# Patient Record
Sex: Male | Born: 1950
Health system: Southern US, Community
[De-identification: ages and names within clinical notes are randomized; demographics above are authoritative.]

## PROBLEM LIST (undated history)

## (undated) DIAGNOSIS — G709 Myoneural disorder, unspecified: Secondary | ICD-10-CM

## (undated) DIAGNOSIS — G40909 Epilepsy, unspecified, not intractable, without status epilepticus: Secondary | ICD-10-CM

## (undated) DIAGNOSIS — K219 Gastro-esophageal reflux disease without esophagitis: Secondary | ICD-10-CM

## (undated) HISTORY — DX: Gastro-esophageal reflux disease without esophagitis: K21.9

## (undated) HISTORY — DX: Myoneural disorder, unspecified: G70.9

## (undated) HISTORY — PX: EYE SURGERY: SHX253

## (undated) HISTORY — DX: Epilepsy, unspecified, not intractable, without status epilepticus: G40.909

## (undated) HISTORY — PX: SPINE SURGERY: SHX786

## (undated) HISTORY — PX: OTHER SURGICAL HISTORY: SHX169

---

## 2006-09-22 ENCOUNTER — Encounter: Payer: Self-pay | Admitting: Family Medicine

## 2006-09-22 LAB — CONVERTED CEMR LAB
HCT: 44.7 %
Hemoglobin: 15.2 g/dL
Platelets: 215 10*3/uL
WBC, blood: 6.1 10*3/uL

## 2006-12-20 ENCOUNTER — Encounter: Payer: Self-pay | Admitting: Family Medicine

## 2006-12-20 LAB — CONVERTED CEMR LAB
HDL: 45 mg/dL
Triglycerides: 183 mg/dL

## 2007-01-18 ENCOUNTER — Ambulatory Visit: Payer: Self-pay | Admitting: Family Medicine

## 2007-01-18 DIAGNOSIS — G40909 Epilepsy, unspecified, not intractable, without status epilepticus: Secondary | ICD-10-CM | POA: Insufficient documentation

## 2007-01-18 LAB — CONVERTED CEMR LAB
Blood in Urine, dipstick: NEGATIVE
Ketones, urine, test strip: NEGATIVE
Nitrite: NEGATIVE
Specific Gravity, Urine: 1.02
Urobilinogen, UA: NEGATIVE
WBC Urine, dipstick: NEGATIVE

## 2007-01-25 ENCOUNTER — Encounter: Payer: Self-pay | Admitting: Family Medicine

## 2007-02-04 ENCOUNTER — Encounter: Payer: Self-pay | Admitting: Family Medicine

## 2007-02-22 ENCOUNTER — Encounter: Payer: Self-pay | Admitting: Family Medicine

## 2007-02-23 ENCOUNTER — Encounter: Payer: Self-pay | Admitting: Family Medicine

## 2007-02-23 LAB — CONVERTED CEMR LAB
ALT: 24 units/L
AST: 16 units/L
Albumin: 4.4 g/dL
Alkaline Phosphatase: 69 units/L
Calcium: 9 mg/dL
Chloride: 104 meq/L
HDL: 47 mg/dL
LDL Cholesterol: 144 mg/dL
Potassium: 4.2 meq/L
Sodium: 141 meq/L
Total Protein: 7 g/dL
Triglycerides: 211 mg/dL

## 2007-03-02 ENCOUNTER — Encounter: Admission: RE | Admit: 2007-03-02 | Discharge: 2007-03-02 | Payer: Self-pay | Admitting: Family Medicine

## 2007-03-02 ENCOUNTER — Telehealth: Payer: Self-pay | Admitting: Family Medicine

## 2007-03-02 ENCOUNTER — Ambulatory Visit: Payer: Self-pay | Admitting: Family Medicine

## 2007-03-03 ENCOUNTER — Encounter: Payer: Self-pay | Admitting: Family Medicine

## 2007-03-04 ENCOUNTER — Encounter: Admission: RE | Admit: 2007-03-04 | Discharge: 2007-03-14 | Payer: Self-pay | Admitting: Family Medicine

## 2007-03-14 ENCOUNTER — Encounter: Payer: Self-pay | Admitting: Family Medicine

## 2007-03-16 ENCOUNTER — Ambulatory Visit: Payer: Self-pay | Admitting: Family Medicine

## 2007-03-16 DIAGNOSIS — R7301 Impaired fasting glucose: Secondary | ICD-10-CM | POA: Insufficient documentation

## 2007-04-01 ENCOUNTER — Encounter: Payer: Self-pay | Admitting: Family Medicine

## 2007-04-14 ENCOUNTER — Telehealth (INDEPENDENT_AMBULATORY_CARE_PROVIDER_SITE_OTHER): Payer: Self-pay | Admitting: *Deleted

## 2007-05-06 ENCOUNTER — Encounter: Payer: Self-pay | Admitting: Family Medicine

## 2007-06-17 ENCOUNTER — Ambulatory Visit: Payer: Self-pay | Admitting: Family Medicine

## 2007-06-24 ENCOUNTER — Encounter: Payer: Self-pay | Admitting: Family Medicine

## 2007-07-15 ENCOUNTER — Telehealth: Payer: Self-pay | Admitting: Family Medicine

## 2007-07-18 ENCOUNTER — Telehealth: Payer: Self-pay | Admitting: Family Medicine

## 2007-08-05 ENCOUNTER — Encounter: Payer: Self-pay | Admitting: Family Medicine

## 2007-11-11 ENCOUNTER — Encounter: Admission: RE | Admit: 2007-11-11 | Discharge: 2007-11-11 | Payer: Self-pay | Admitting: General Surgery

## 2007-11-15 ENCOUNTER — Encounter (INDEPENDENT_AMBULATORY_CARE_PROVIDER_SITE_OTHER): Payer: Self-pay | Admitting: General Surgery

## 2007-11-15 ENCOUNTER — Ambulatory Visit (HOSPITAL_BASED_OUTPATIENT_CLINIC_OR_DEPARTMENT_OTHER): Admission: RE | Admit: 2007-11-15 | Discharge: 2007-11-15 | Payer: Self-pay | Admitting: General Surgery

## 2008-04-10 ENCOUNTER — Telehealth: Payer: Self-pay | Admitting: Family Medicine

## 2008-06-19 ENCOUNTER — Ambulatory Visit: Payer: Self-pay | Admitting: Family Medicine

## 2008-06-20 ENCOUNTER — Encounter: Payer: Self-pay | Admitting: Family Medicine

## 2008-06-20 DIAGNOSIS — E785 Hyperlipidemia, unspecified: Secondary | ICD-10-CM | POA: Insufficient documentation

## 2008-06-20 LAB — CONVERTED CEMR LAB
ALT: 27 units/L (ref 0–53)
Alkaline Phosphatase: 64 units/L (ref 39–117)
CO2: 24 meq/L (ref 19–32)
Cholesterol: 269 mg/dL — ABNORMAL HIGH (ref 0–200)
Creatinine, Ser: 1.01 mg/dL (ref 0.40–1.50)
HCT: 46.7 % (ref 39.0–52.0)
LDL Cholesterol: 179 mg/dL — ABNORMAL HIGH (ref 0–99)
MCHC: 32.3 g/dL (ref 30.0–36.0)
MCV: 92.8 fL (ref 78.0–100.0)
PSA: 0.9 ng/mL (ref 0.10–4.00)
Platelets: 254 10*3/uL (ref 150–400)
RDW: 13.4 % (ref 11.5–15.5)
Sodium: 141 meq/L (ref 135–145)
Total Bilirubin: 0.5 mg/dL (ref 0.3–1.2)
Total CHOL/HDL Ratio: 5.8
VLDL: 44 mg/dL — ABNORMAL HIGH (ref 0–40)
WBC: 6.6 10*3/uL (ref 4.0–10.5)

## 2008-11-26 ENCOUNTER — Encounter: Payer: Self-pay | Admitting: Family Medicine

## 2009-06-24 ENCOUNTER — Ambulatory Visit: Payer: Self-pay | Admitting: Family Medicine

## 2009-06-24 DIAGNOSIS — G588 Other specified mononeuropathies: Secondary | ICD-10-CM | POA: Insufficient documentation

## 2009-06-26 LAB — CONVERTED CEMR LAB
ALT: 32 units/L (ref 0–53)
AST: 18 units/L (ref 0–37)
Carbamazepine Lvl: 3.6 ug/mL — ABNORMAL LOW (ref 4.0–12.0)
Chloride: 104 meq/L (ref 96–112)
Creatinine, Ser: 0.96 mg/dL (ref 0.40–1.50)
HCT: 43.7 % (ref 39.0–52.0)
LDL Cholesterol: 167 mg/dL — ABNORMAL HIGH (ref 0–99)
MCV: 93.4 fL (ref 78.0–100.0)
PSA: 1.96 ng/mL (ref 0.10–4.00)
Platelets: 263 10*3/uL (ref 150–400)
RDW: 13 % (ref 11.5–15.5)
Sodium: 142 meq/L (ref 135–145)
Total Bilirubin: 0.4 mg/dL (ref 0.3–1.2)
Total CHOL/HDL Ratio: 4.4
Total Protein: 7.3 g/dL (ref 6.0–8.3)
VLDL: 20 mg/dL (ref 0–40)

## 2009-12-27 ENCOUNTER — Ambulatory Visit: Payer: Self-pay | Admitting: Family Medicine

## 2010-01-22 ENCOUNTER — Encounter: Payer: Self-pay | Admitting: Family Medicine

## 2010-01-24 ENCOUNTER — Encounter: Payer: Self-pay | Admitting: Family Medicine

## 2010-03-27 IMAGING — CR DG CHEST 2V
2 series · 2 of 2 positions shown · non-contrast
Comparison: None

CLINICAL DATA: Preop for resection of lipoma

CHEST - 2 VIEW

[w chest pa *]
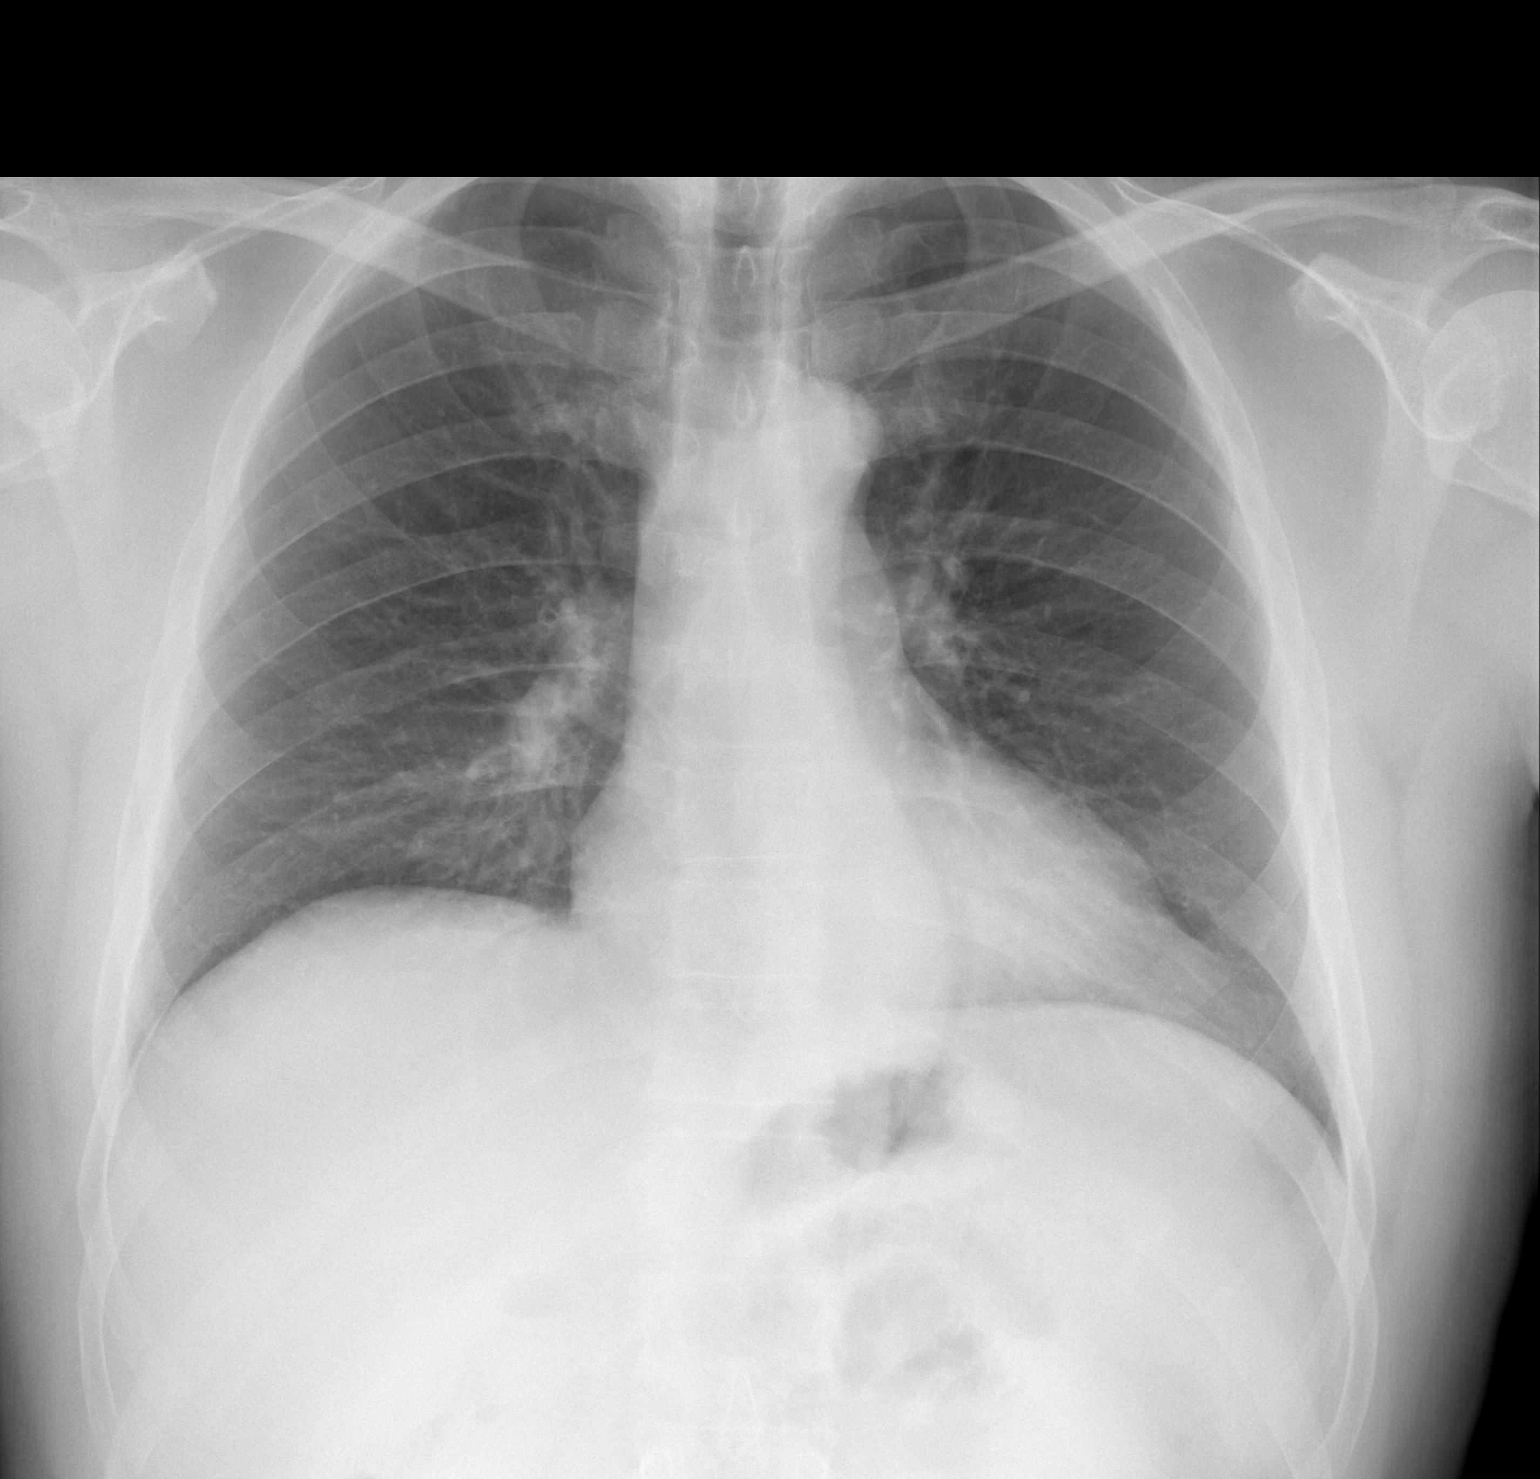

[w chest lat *]
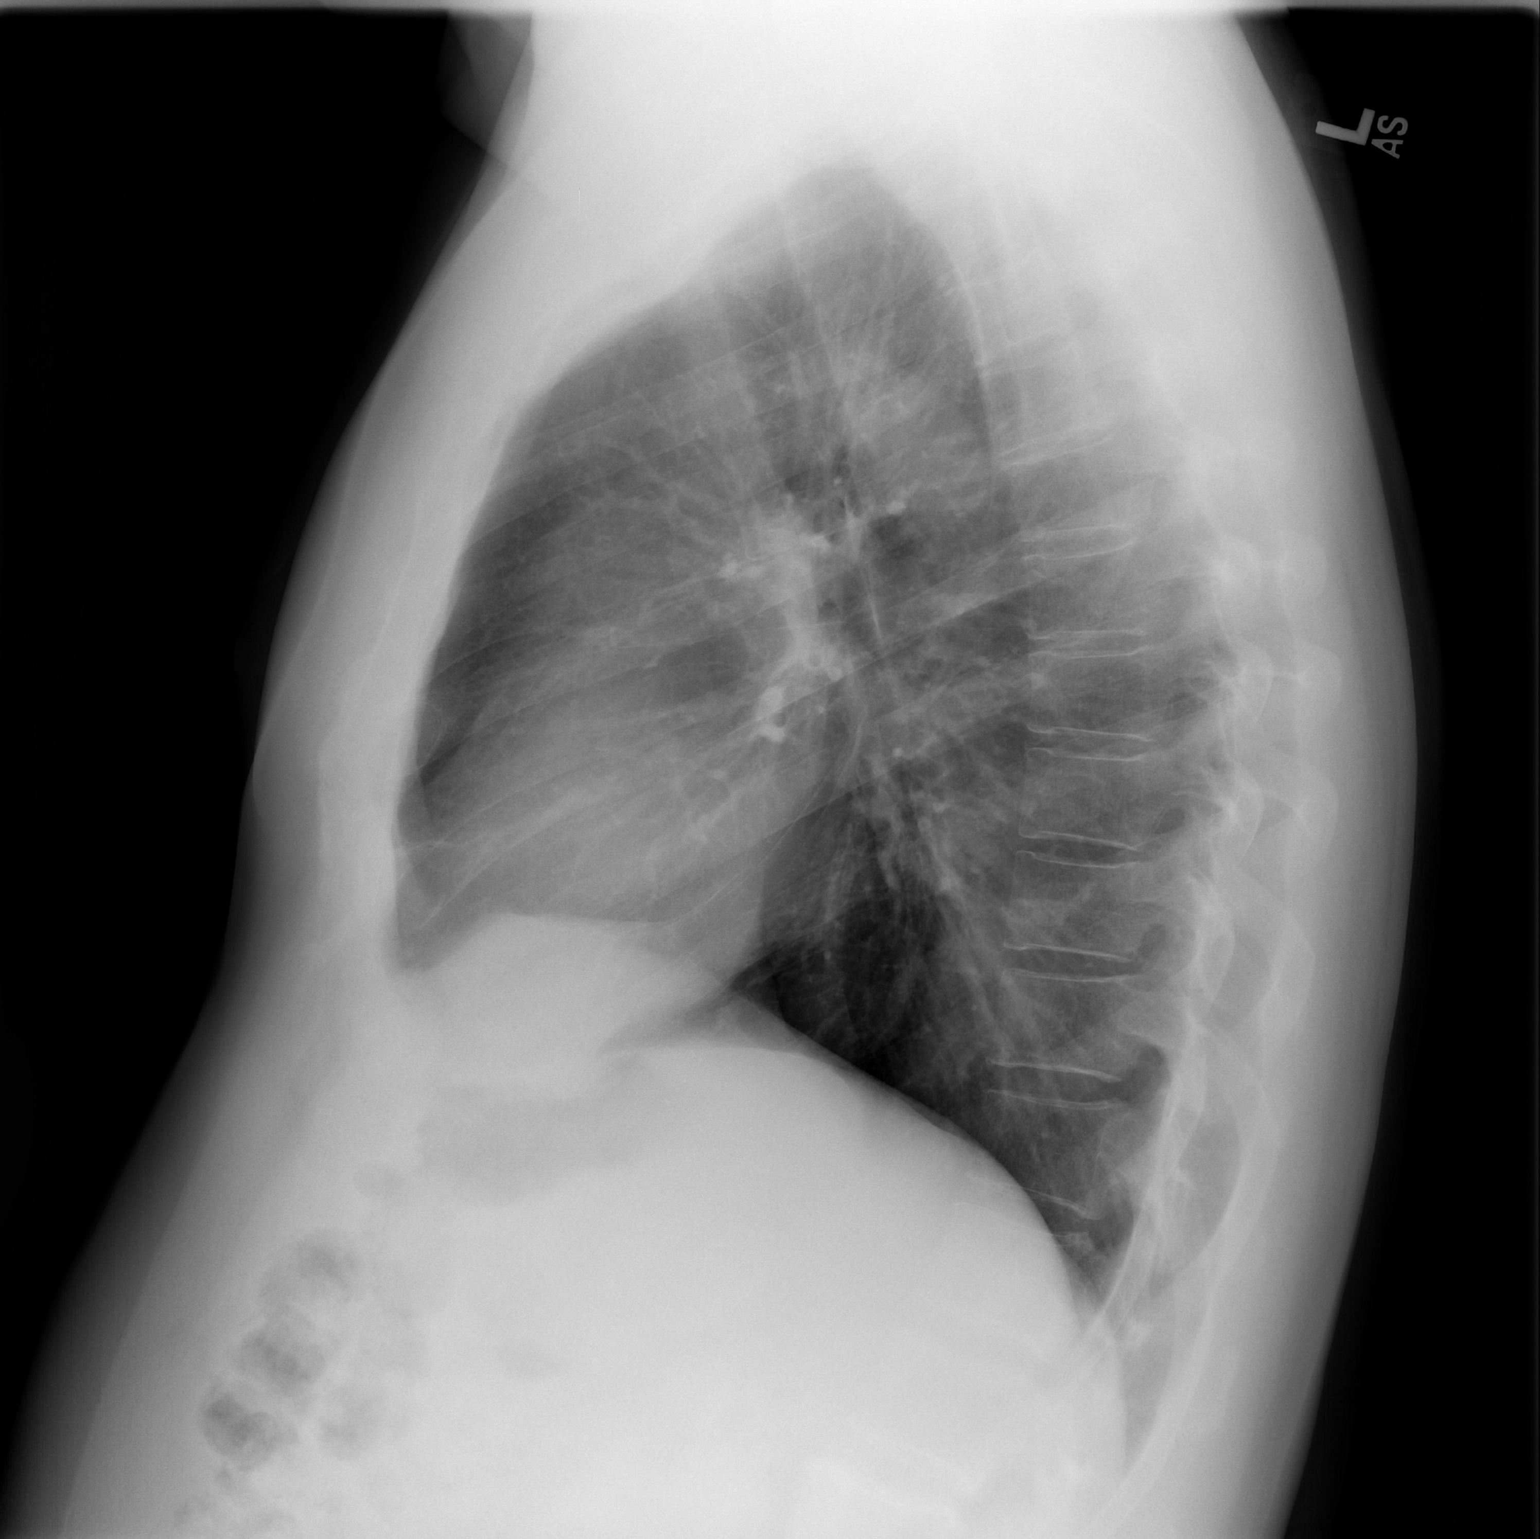

[2 of 2 positions shown; findings below may reference images not displayed]

FINDINGS: Two views of the chest show the lungs to be clear.  Mild
peribronchial thickening is noted.  Heart is within upper limits of
normal.  The descending thoracic aorta is tortuous.  No bony
abnormality is seen.
IMPRESSION: No active lung disease.  Mild peribronchial thickening.

## 2010-06-30 ENCOUNTER — Ambulatory Visit
Admission: RE | Admit: 2010-06-30 | Discharge: 2010-06-30 | Payer: Self-pay | Source: Home / Self Care | Attending: Family Medicine | Admitting: Family Medicine

## 2010-06-30 ENCOUNTER — Encounter: Payer: Self-pay | Admitting: Family Medicine

## 2010-07-01 LAB — CONVERTED CEMR LAB
ALT: 24 units/L (ref 0–53)
AST: 18 units/L (ref 0–37)
Albumin: 4.5 g/dL (ref 3.5–5.2)
Alkaline Phosphatase: 67 units/L (ref 39–117)
BUN: 10 mg/dL (ref 6–23)
Carbamazepine Lvl: 4.5 ug/mL (ref 4.0–12.0)
HDL: 51 mg/dL (ref >39)
LDL Cholesterol: 147 mg/dL — ABNORMAL HIGH (ref 0–99)
MCHC: 32.9 g/dL (ref 30.0–36.0)
Platelets: 216 10*3/uL (ref 150–400)
Potassium: 4.2 meq/L (ref 3.5–5.3)
RDW: 12.9 % (ref 11.5–15.5)
Sodium: 138 meq/L (ref 135–145)
Total Protein: 7.2 g/dL (ref 6.0–8.3)

## 2010-07-15 NOTE — Assessment & Plan Note (Signed)
Summary: f/u seizures   Vital Signs:  Patient profile:   60 year old male Height:      71.5 inches Weight:      201 pounds BMI:     27.74 O2 Sat:      100 % on Room air Temp:     98.0 degrees F oral Pulse rate:   63 / minute BP sitting:   135 / 79  (left arm) Cuff size:   large  Vitals Entered By: Payton Spark CMA (June 24, 2009 8:35 AM)  O2 Flow:  Room air CC: F/U med refill on Tegretol   Primary Care Provider:  Seymour Bars DO  CC:  F/U med refill on Tegretol.  History of Present Illness: 60 yo  WM presents for f/u seizures and pudendal nerve pain.  He is due for a RF of Tegretol, labs and his annual optho exam.  He remains seizure - free.  Feels well other than chronic pudendal nerve pain x 2 yrs.  He is now seeing a specialist in [place] and has completed 3 nerve blocks.  He also tried gabapentin but it did not do much.  He has done massage and PT for this problem which only relieved pain for the short term.  He is looking at having a decompression surgery so that he can have reduced pain and resume running again.  He is waiting for his insurance company to approve this.    Current Medications (verified): 1)  Tegretol 200 Mg  Tabs (Carbamazepine) .... Take 1 Tablet By Mouth Once A Day  Allergies (verified): No Known Drug Allergies  Past History:  Past Medical History: epilepsy since his 60's borderline cholestrol  Past Surgical History: Reviewed history from 01/18/2007 and no changes required. vasectomy  Social History: Reviewed history from 01/18/2007 and no changes required. 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  Has 3 kids. Never smoked. Walks 30 min 3 x a wk. 4 coffee/ day 3 ETOH/ wk  Review of Systems      See HPI  Physical Exam  General:  alert, well-developed, well-nourished, and well-hydrated.   Head:  normocephalic, atraumatic, and male-pattern balding.   Eyes:  pupils equal, pupils round, and  pupils reactive to light.   Ears:  no external deformities.   Nose:  no nasal discharge.   Mouth:  good dentition and pharynx pink and moist.   Neck:  no masses.   Lungs:  Normal respiratory effort, chest expands symmetrically. Lungs are clear to auscultation, no crackles or wheezes. Heart:  Normal rate and regular rhythm. S1 and S2 normal without gallop, murmur, click, rub or other extra sounds. Abdomen:  soft, non-tender, and no distention.   Extremities:  no E/C/C Skin:  color normal.   Cervical Nodes:  No lymphadenopathy noted Psych:  good eye contact, not anxious appearing, and not depressed appearing.     Impression & Recommendations:  Problem # 1:  SEIZURE DISORDER (ICD-780.39) Update labs.  Pt will call for his annual eye exam.  RFd Tegretol.   His updated medication list for this problem includes:    Tegretol 200 Mg Tabs (Carbamazepine) .Marland Kitchen... Take 1 tablet by mouth once a day    Clonazepam 0.5 Mg Tabs (Clonazepam) .Marland Kitchen... 1 tab by mouth qpm  Orders: T-CBC No Diff ([id number]) T-Comprehensive Metabolic Panel (09811-91478) T-Tegretol (Carbamazepine) (29562-13086)  Problem # 2:  PELVIC PAIN, CHRONIC (ICD-789.09) We reviewed his treatment course of Amitriptyline at bedtime and  pudendal nerve blocks in [place].  He has yet to be relieved.  It appears that decompression surgery is in his future.   The following medications were removed from the medication list:    Bl Ibuprofen 200 Mg Tabs (Ibuprofen) .Marland Kitchen... Take 1 tablet by mouth two times a day  Complete Medication List: 1)  Tegretol 200 Mg Tabs (Carbamazepine) .... Take 1 tablet by mouth once a day 2)  Amitriptyline Hcl 75 Mg Tabs (Amitriptyline hcl) .... 1/2 tab by mouth qpm 3)  Clonazepam 0.5 Mg Tabs (Clonazepam) .Marland Kitchen.. 1 tab by mouth qpm  Other Orders: T-Lipid Profile (16109-60454) T-PSA (09811-91478)  Patient Instructions: 1)  Tegretol RFd 2)  Update labs today. 3)  As a reminder, you need an annual eye exam while  on Tegretol. 4)  Return in 6-12 mos for a CPE/ EKG. Prescriptions: TEGRETOL 200 MG  TABS (CARBAMAZEPINE) Take 1 tablet by mouth once a day  #90 x 3   Entered and Authorized by:   Seymour Bars DO   Signed by:   Seymour Bars DO on 06/24/2009   Method used:   Electronically to        [facility] [phone]* (retail)       [address] [place], [place]  [address]       Ph: [phone]       Fax: [phone]   RxID:   [id number]

## 2010-07-15 NOTE — Letter (Signed)
Summary: [facility]   Imported By: Lanelle Bal 02/04/2010 12:28:21  _____________________________________________________________________  External Attachment:    Type:   Image     Comment:   External Document

## 2010-07-15 NOTE — Assessment & Plan Note (Signed)
Summary: preop exam   Vital Signs:  Patient profile:   60 year old male Height:      71.5 inches Weight:      203 pounds BMI:     28.02 O2 Sat:      99 % on Room air Pulse rate:   83 / minute BP sitting:   129 / 88  (left arm) Cuff size:   large  Vitals Entered By: Payton Spark CMA (December 27, 2009 8:32 AM)  O2 Flow:  Room air CC: Surgical clearance   Primary Care Provider:  Seymour Bars DO  CC:  Surgical clearance.  History of Present Illness: 60 yo WM presents for pre-op clearance for upcoming pudendal nerve decompression surgery to be done in [place].  he is scheduled for Aug 10th, 2011.  He has suffered with pain that has haulted his ability to run for the past 3 yrs.  he is currently taking Amitriptyline at night + Clonazepam at night to help with nerve pain and sleep.  He also has well contolled epilepsy, on Tegretol.  His labs are UTD.  He has no underlying cardiac or pulmonary dz.  He does not have HTN and is not taking any blood thinners.  He is not a smoker and has never had a reaction to anesthesia.    EKG and Hgb are due today.   Current Medications (verified): 1)  Tegretol 200 Mg  Tabs (Carbamazepine) .... Take 1 Tablet By Mouth Once A Day 2)  Amitriptyline Hcl 75 Mg Tabs (Amitriptyline Hcl) .... 1/2 Tab By Mouth Qpm 3)  Clonazepam 0.5 Mg Tabs (Clonazepam) .Marland Kitchen.. 1 Tab By Mouth Qpm 4)  Sytrinol  Caps (Nutritional Supplements)  Allergies (verified): No Known Drug Allergies  Past History:  Past Medical History: Reviewed history from 06/24/2009 and no changes required. epilepsy since his 69's borderline cholestrol  Past Surgical History: Reviewed history from 01/18/2007 and no changes required. vasectomy  Family History: Reviewed history from 06/17/2007 and no changes required. mother died at 71 from CHF 1 brother w/ HTN 1 brother healthy 2 sisters healthy father unknown son - type I DM  Social History: Reviewed history from 01/18/2007 and no  changes required. 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  Has 3 kids. Never smoked. Walks 30 min 3 x a wk. 4 coffee/ day 3 ETOH/ wk  Review of Systems  The patient denies fever, chest pain, and peripheral edema.    Physical Exam  General:  alert, well-developed, well-nourished, and well-hydrated.   Head:  normocephalic, atraumatic, and male-pattern balding.   Eyes:  pupils equal, pupils round, and pupils reactive to light.   Ears:  no external deformities.   Nose:  no nasal discharge.   Mouth:  good dentition and pharynx pink and moist.   Neck:  no masses.   Lungs:  Normal respiratory effort, chest expands symmetrically. Lungs are clear to auscultation, no crackles or wheezes. Heart:  Normal rate and regular rhythm. S1 and S2 normal without gallop, murmur, click, rub or other extra sounds. Extremities:  no LE edema Neurologic:  gait normal.   Skin:  color normal.   Psych:  good eye contact, not anxious appearing, and not depressed appearing.     Impression & Recommendations:  Problem # 1:  PREOPERATIVE EXAMINATION (ICD-V72.84)  Hgb normal at 14.3.  EKG NSR wtih occasional PVCs.  No sign of ischemia. REviewed checklsit from surgeon.  He only needed EKG and Hgb  today. Had normal labs in Jan.  Attached a copy. He is to stay on all of his meds leading up to surgery.  Will f/u with me as needed after surgery.  Orders: Fingerstick (36416) Hgb (16109) EKG w/ Interpretation (93000)  Complete Medication List: 1)  Tegretol 200 Mg Tabs (Carbamazepine) .... Take 1 tablet by mouth once a day 2)  Amitriptyline Hcl 75 Mg Tabs (Amitriptyline hcl) .... 1/2 tab by mouth qpm 3)  Clonazepam 0.5 Mg Tabs (Clonazepam) .Marland Kitchen.. 1 tab by mouth qpm 4)  Sytrinol Caps (Nutritional supplements)   Laboratory Results   CBC   HGB:  14.3 g/dL   (Normal Range: 60.4-54.0 in Males, 12.0-15.0 in Females)

## 2010-07-15 NOTE — Op Note (Signed)
Summary: Nerve Decompression/[facility]  Nerve Decompression/[facility]   Imported By: Lanelle Bal 02/04/2010 10:29:02  _____________________________________________________________________  External Attachment:    Type:   Image     Comment:   External Document

## 2010-07-17 NOTE — Assessment & Plan Note (Signed)
Summary: CPE   Vital Signs:  Patient profile:   60 year old male Height:      71.5 inches Weight:      206 pounds BMI:     28.43 O2 Sat:      100 % on Room air Pulse rate:   71 / minute BP sitting:   131 / 83  (left arm) Cuff size:   large  Vitals Entered By: Payton Spark CMA (June 30, 2010 8:25 AM)  O2 Flow:  Room air CC: CPE   Primary Care Provider:  Seymour Bars DO  CC:  CPE.  History of Present Illness: 60 yo WM presents for CPE.  He is doing well.  Due for fasting labs, prostate cancer screening.  Had repeat colonoscopy in 08 and was told to return in 5 yrs (2013).  Denies any change in bowels or blood in his stools.  Has some problems with complete bladder emptying after having a pudendal nerve decompression for chronic pelvic pain 5 mos ago.    He is seizure free on Tegretol, due for RF.  Denies fam hx of premature heart dz.  Walking and lifting weights but has not returned to running yet.    His tetanus vaccine is due.  Current Medications (verified): 1)  Tegretol 200 Mg  Tabs (Carbamazepine) .... Take 1 Tablet By Mouth Once A Day 2)  Amitriptyline Hcl 75 Mg Tabs (Amitriptyline Hcl) .... 1/2 Tab By Mouth Qpm 3)  Clonazepam 0.5 Mg Tabs (Clonazepam) .Marland Kitchen.. 1 Tab By Mouth Qpm 4)  Sytrinol  Caps (Nutritional Supplements)  Allergies (verified): No Known Drug Allergies  Past History:  Past Medical History: Reviewed history from 06/24/2009 and no changes required. epilepsy since his 13's borderline cholestrol  Past Surgical History: vasectomy pudendal nerve decompression at [facility] 8[facility]  Family History: Reviewed history from 06/17/2007 and no changes required. mother died at 73 from CHF 1 brother w/ HTN 1 brother healthy 2 sisters healthy father unknown son - type I DM  Social History: Reviewed history from 01/18/2007 and no changes required. 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  Has 3 kids. Never  smoked. Walks 30 min 3 x a wk. 4 coffee/ day 3 ETOH/ wk  Review of Systems  The patient denies anorexia, fever, weight loss, weight gain, vision loss, decreased hearing, hoarseness, chest pain, syncope, dyspnea on exertion, peripheral edema, prolonged cough, headaches, hemoptysis, abdominal pain, melena, hematochezia, severe indigestion/heartburn, hematuria, incontinence, genital sores, muscle weakness, suspicious skin lesions, transient blindness, difficulty walking, depression, unusual weight change, abnormal bleeding, enlarged lymph nodes, angioedema, breast masses, and testicular masses.    Physical Exam  General:  alert, well-developed, well-nourished, well-hydrated, and overweight-appearing.   Head:  normocephalic, atraumatic, and male-pattern balding.   Eyes:  pupils equal, pupils round, and pupils reactive to light.   Ears:  EACs patent; TMs translucent and gray with good cone of light and bony landmarks.  Nose:  no nasal discharge.   Mouth:  good dentition and pharynx pink and moist.   Neck:  no masses.   Lungs:  Normal respiratory effort, chest expands symmetrically. Lungs are clear to auscultation, no crackles or wheezes. Heart:  Normal rate and regular rhythm. S1 and S2 normal without gallop, murmur, click, rub or other extra sounds. Abdomen:  Bowel sounds positive,abdomen soft and non-tender without masses, organomegaly or hernias noted. Rectal:  hemoccult neg, no external abnormalities and no hemorrhoids.   Prostate:  Prostate gland  firm and smooth, no enlargement, nodularity, tenderness, mass, asymmetry or induration. Pulses:  2+ radial and pedal pulses no AA bruits Extremities:  no UE or LE edema Skin:  color normal.  multiple cherry hemangiomas Cervical Nodes:  No lymphadenopathy noted Psych:  good eye contact, not anxious appearing, and not depressed appearing.     Impression & Recommendations:  Problem # 1:  HEALTH MAINTENANCE EXAM (ICD-V70.0) Keeping healthy  checklist for men reviewed. BP at goal.  BMI 28 = overwt. Tdap updated today. Colonoscopy due 2013. DRE and PSA today for prostate cancer screening. Fasting labs today. Tegretol RFD, level added to labs and f/u seizure d/o in 6 mos. Work on [employer], regular exercise, ASA 81 mg/ day and MVI dialy.  Complete Medication List: 1)  Tegretol 200 Mg Tabs (Carbamazepine) .... Take 1 tablet by mouth once a day 2)  Amitriptyline Hcl 75 Mg Tabs (Amitriptyline hcl) .... 1/2 tab by mouth qpm 3)  Clonazepam 0.5 Mg Tabs (Clonazepam) .Marland Kitchen.. 1 tab by mouth qpm 4)  Sytrinol Caps (Nutritional supplements)  Other Orders: T-CBC No Diff ([id number]) T-Comprehensive Metabolic Panel (47829-56213) T-Lipid Profile (08657-84696) T-PSA (29528-41324) T-Tegretol (Carbamazepine) (40102-72536) Tdap => 10yrs IM (64403) Admin 1st Vaccine (47425) DRE (Z5638)  Patient Instructions: 1)  Keep up the good work. 2)  You can increase exercise slowly into running. 3)  Labs today. 4)  Will call you w/ results tomorrow. 5)  Tegretol RFd. 6)  F/U in 6 mos. Prescriptions: TEGRETOL 200 MG  TABS (CARBAMAZEPINE) Take 1 tablet by mouth once a day  #90 x 3   Entered and Authorized by:   Seymour Bars DO   Signed by:   Seymour Bars DO on 06/30/2010   Method used:   Electronically to        [facility] [phone]* (retail)       [address] [place], [place]  [address]       Ph: [phone]       Fax: [phone]   RxID:   [id number]    Orders Added: 1)  T-CBC No Diff [[id number]] 2)  T-Comprehensive Metabolic Panel [80053-22900] 3)  T-Lipid Profile [80061-22930] 4)  T-PSA [76283-15176] 5)  T-Tegretol (Carbamazepine) [80156-23380] 6)  Tdap => 36yrs IM [90715] 7)  Admin 1st Vaccine [90471] 8)  Est. Patient age 49-64 [48] 9)  DRE [G0102]   Immunizations Administered:  Tetanus Vaccine:    Vaccine Type: Tdap    Site: left deltoid    Dose: 0.5 ml    Route: IM    Given by: Payton Spark CMA     Exp. Date: 04/03/2012    Lot #: [id number]    VIS given: 05/02/08 version given June 30, 2010.   Immunizations Administered:  Tetanus Vaccine:    Vaccine Type: Tdap    Site: left deltoid    Dose: 0.5 ml    Route: IM    Given by: Payton Spark CMA    Exp. Date: 04/03/2012    Lot #: [id number]    VIS given: 05/02/08 version given June 30, 2010.

## 2010-10-28 NOTE — Op Note (Signed)
NAME:  Robert, Hodges NO.:  [id number]   MEDICAL RECORD NO.:  [id number]          PATIENT TYPE:  AMB   LOCATION:  [facility]                         FACILITY:  [facility]   PHYSICIAN:  Anselm Pancoast. Weatherly, M.D.DATE OF BIRTH:  [birth year]   DATE OF PROCEDURE:  11/15/2007  DATE OF DISCHARGE:                               OPERATIVE REPORT   PREOPERATIVE DIAGNOSIS:  Multiple lipomas.   POSTOPERATIVE DIAGNOSES:  Multiple lipomas.   OPERATION:  Excision of multiple lipomas, left and right inguinal crease  areas and also left lower anterior chest.   ANESTHESIA:  General anesthesia.   SURGEON:  Anselm Pancoast. Zachery Dakins, M.D.   INDICATIONS FOR PROCEDURE:  [name] is a 60 year old male who  was referred to me for lipomas.  It appears that he has had a problem of  epididymitis in the right groin.  He has multiple lipomas in the right  lower inguinal crease areas and also in the left.  Then he has a lipoma  over the left anterior chest.  He had more discomfort and he thinks it  is related to his belts and clothes pushing on the area, and Dr. Heloise Purpura, who treated him for the epididymitis referred him to me.  He had  seen Dr. Jamison Neighbor who also and got a CT, and there was nothing  noted on the CT that caused the pain.  You could feel the lipomas that  feel like atypical lipomas in the right groin area lateral.  There is  probably about a 4 cm area and there is actually a smaller one about 2  cm below it on the left.  There is about a 3 cm lipoma  and there is  probably about a 2 cm to 3 cm lipoma at the left lower anterior chest,  to the left of midline.  I recommended that all of these be excised with  general anesthesia, since he is asymptomatic.  I feel that it would be  difficult to do all of them with local anesthesia.  The patient was in  agreement, and we hope that this is going to take care of his pain.   DESCRIPTION OF PROCEDURE:  The patient had marked  all the areas  preoperatively and I marked them again, so that the marks were a little  more obvious.  He was given 1 gram of Ancef and then taken to the  operating room suite.  Induction of general anesthesia with an LOA tube  and then draped in a sterile manner.  After these areas had been  clipped, and then painted with Betadine solution and then draped in a  sterile manner.  I elected to remove the one over the anterior chest  first and it was two little obvious lipomas and then surrounding adipose  tissue.  This all was removed and there were a couple of little bleeders  sutured with #1-0 Vicryl.  I then closed the little areas with #4-0  Vicryl in the subcutaneous and then later put #5-0 Monocryl sutures and  then Dermabond on the skin.  I next did the left groin area.  It was a  larger lipoma, and the little bleeders were sutured with #4-0 Vicryl.  Then the subcutaneous with #4-0 Vicryl and then #5-0 Monocryl and later  Dermabond on the skin.  The one on the right which was the largest was  done last.  I elected to make an incision between the two, and then  dissected up, freeing up the larger lipoma from the surrounding normal  adipose tissue.  Then removing the fatty tumor below the incision that  was really ripe, sort of over or below the ilioinguinal crease area.  The bleeders were sutured or tied with #4-0  Vicryl, and then also the Monocryl and then Dermabond placed on all the  incisions.  We waited for the Dermabond to dry and then put a little  occlusal dressing over the area and he can shower on Thursday or Friday.   I will see him back in the office at the end of next week.  Hopefully  his discomfort will be resolved.           ______________________________  Anselm Pancoast. Zachery Dakins, M.D.     WJW/MEDQ  D:  11/15/2007  T:  11/15/2007  Job:  [id number]   cc:   Heloise Purpura, MD  Fax: [phone]

## 2011-03-11 LAB — DIFFERENTIAL
Eosinophils Absolute: 0.4
Eosinophils Relative: 6 — ABNORMAL HIGH
Lymphs Abs: 2
Monocytes Absolute: 0.5
Monocytes Relative: 8

## 2011-03-11 LAB — COMPREHENSIVE METABOLIC PANEL
ALT: 36
AST: 22
CO2: 31
Calcium: 9.3
GFR calc Af Amer: >60
GFR calc non Af Amer: >60
Sodium: 138
Total Protein: 6.3

## 2011-03-11 LAB — CBC
MCHC: 32.5
RBC: 4.8

## 2011-07-16 ENCOUNTER — Encounter: Payer: Self-pay | Admitting: Family Medicine

## 2011-07-16 ENCOUNTER — Ambulatory Visit (INDEPENDENT_AMBULATORY_CARE_PROVIDER_SITE_OTHER): Payer: PRIVATE HEALTH INSURANCE | Admitting: Family Medicine

## 2011-07-16 VITALS — BP 117/76 | HR 71 | Ht 71.5 in | Wt 183.0 lb

## 2011-07-16 DIAGNOSIS — G40909 Epilepsy, unspecified, not intractable, without status epilepticus: Secondary | ICD-10-CM

## 2011-07-16 DIAGNOSIS — Z86010 Personal history of colonic polyps: Secondary | ICD-10-CM

## 2011-07-16 DIAGNOSIS — G588 Other specified mononeuropathies: Secondary | ICD-10-CM

## 2011-07-16 DIAGNOSIS — Z1211 Encounter for screening for malignant neoplasm of colon: Secondary | ICD-10-CM

## 2011-07-16 DIAGNOSIS — Z0000 Encounter for general adult medical examination without abnormal findings: Secondary | ICD-10-CM

## 2011-07-16 DIAGNOSIS — Z2821 Immunization not carried out because of patient refusal: Secondary | ICD-10-CM

## 2011-07-16 LAB — CBC WITH DIFFERENTIAL/PLATELET
Eosinophils Relative: 2 % (ref 0–5)
HCT: 41.3 % (ref 39.0–52.0)
Lymphocytes Relative: 32 % (ref 12–46)
Lymphs Abs: 1.7 10*3/uL (ref 0.7–4.0)
MCV: 94.7 fL (ref 78.0–100.0)
Monocytes Absolute: 0.3 10*3/uL (ref 0.1–1.0)
Neutro Abs: 3.2 10*3/uL (ref 1.7–7.7)
Platelets: 190 10*3/uL (ref 150–400)
RBC: 4.36 MIL/uL (ref 4.22–5.81)
WBC: 5.4 10*3/uL (ref 4.0–10.5)

## 2011-07-16 LAB — POCT URINALYSIS DIPSTICK
Ketones, UA: NEGATIVE
Protein, UA: NEGATIVE
Spec Grav, UA: 1.02
pH, UA: 7.5

## 2011-07-16 LAB — COMPLETE METABOLIC PANEL WITHOUT GFR
ALT: 18 U/L (ref 0–53)
AST: 19 U/L (ref 0–37)
Albumin: 4.8 g/dL (ref 3.5–5.2)
Alkaline Phosphatase: 61 U/L (ref 39–117)
BUN: 12 mg/dL (ref 6–23)
CO2: 27 meq/L (ref 19–32)
Calcium: 9.3 mg/dL (ref 8.4–10.5)
Chloride: 105 meq/L (ref 96–112)
Creat: 0.98 mg/dL (ref 0.50–1.35)
GFR, Est African American: >89 mL/min
GFR, Est Non African American: 82 mL/min
Glucose, Bld: 92 mg/dL (ref 70–99)
Potassium: 4.3 meq/L (ref 3.5–5.3)
Sodium: 142 meq/L (ref 135–145)
Total Bilirubin: 0.5 mg/dL (ref 0.3–1.2)
Total Protein: 6.8 g/dL (ref 6.0–8.3)

## 2011-07-16 LAB — LIPID PANEL
HDL: 53 mg/dL (ref >39)
Total CHOL/HDL Ratio: 3.8 Ratio
Triglycerides: 87 mg/dL (ref <150)

## 2011-07-16 LAB — TSH: TSH: 1.575 u[IU]/mL (ref 0.350–4.500)

## 2011-07-16 LAB — PSA: PSA: 1.28 ng/mL

## 2011-07-16 MED ORDER — CARBAMAZEPINE 200 MG PO TABS: 200.0000 mg | ORAL_TABLET | Freq: Every day | ORAL | Status: DC

## 2011-07-16 NOTE — Progress Notes (Signed)
Subjective:    Patient ID: Robert Hodges, male    DOB: Nov 07, 1950, 61 y.o.   MRN: [id number]  HPI Patient's here for physical examination. #1 pudendal nerve irritation. Patient reports having a history of chronic pudendal nerve irritation requiring surgery. Apparently surgery was not that successful he still has marked pain and discomfort if he sits too long but the biggest challenge he has is secondary to sexual functioning. He reports that after having sexual relations or 5 minutes a pleasure he didn't has pain that can be excruciating for 3 days. He reports his wife not been happy and observing this and they have basically stopped being active at this time. He seems resolved at this would be a chronic problem. He is currently on Elavil and Klonopin in  an attempt to reduce the pain. This was given to him by his pain doctor and he is weaning or planning to wean himself off this medication. #2 history of  dyslipidemia and elevated blood sugar he states he's lost a significant amount of weight 20-30 pounds in the definite check a lipid and A1c on him. #3 history of seizure disorder he takes Tegretol one tablet a day. He states that when he took Allegra dosage he demonstrated marked mental and physical retardation but when he stopped the medicine altogether he experienced a second seizure. They placed him on a single dose of the medication daily and he states he's had good blood levels after 10 years he's been seizure free. #4 immunization review will be done #5 history of colonic polyps. Apparently he has had 2 colonoscopies. The first one was 6 years ago and apparently was found was worrisome enough that they repeated a colonoscopy a year later. No polyps were found at that examination but he was told to return in 5 years which have been in 2012. Since this was done in [place] he'll need referral.   Review of Systems  All other systems reviewed and are negative.      BP 117/76  Pulse 71  Ht 5'  11.5" (1.816 m)  Wt 183 lb (83.008 kg)  BMI 25.17 kg/m2  SpO2 100% Objective:   Physical Exam  Vitals reviewed. Constitutional: He is oriented to person, place, and time. He appears well-developed and well-nourished.  HENT:  Head: Normocephalic and atraumatic.  Right Ear: External ear normal.  Left Ear: External ear normal.  Mouth/Throat: Oropharynx is clear and moist.  Eyes: Pupils are equal, round, and reactive to light. Right eye exhibits no discharge. Left eye exhibits no discharge.  Fundoscopic exam:      The right eye shows no arteriolar narrowing and no AV nicking.       The left eye shows no arteriolar narrowing and no AV nicking.  Neck: Normal range of motion. Neck supple. No JVD present. No tracheal deviation present.  Cardiovascular: Normal rate, regular rhythm, normal heart sounds and intact distal pulses.   No murmur heard. Pulmonary/Chest: Effort normal and breath sounds normal. No respiratory distress. He has no wheezes.  Abdominal: Soft. Bowel sounds are normal. He exhibits no distension. There is no tenderness. Hernia confirmed negative in the right inguinal area and confirmed negative in the left inguinal area.  Genitourinary: Rectum normal, prostate normal, testes normal and penis normal. Rectal exam shows no mass, no tenderness and anal tone normal. Guaiac negative stool. Prostate is not enlarged and not tender. Circumcised. No hypospadias, penile erythema or penile tenderness. No discharge found.  Musculoskeletal: Normal range of motion.  He exhibits no edema.  Lymphadenopathy:    He has no cervical adenopathy.  Neurological: He is alert and oriented to person, place, and time. He has normal reflexes. No cranial nerve deficit.  Skin: Skin is warm and dry. No rash noted. No erythema.  Psychiatric: He has a normal mood and affect. His behavior is normal.      Results for orders placed in visit on 07/16/11  POCT URINALYSIS DIPSTICK      Component Value Range    Color, UA yellow     Clarity, UA clear     Glucose, UA neg     Bilirubin, UA neg     Ketones, UA neg     Spec Grav, UA 1.020     Blood, UA trace-intact     pH, UA 7.5     Protein, UA neg     Urobilinogen, UA 0.2     Nitrite, UA neg     Leukocytes, UA Trace        Assessment & Plan:           #1Pudendal nerve irritation. Follow up w/pain clinic. #2 Hyperlipidemia and elevated BS will obtain A1c and lipid panel #3 seizure disorder renew his Tegretol 200 mg a day #4 referral to GI will be done. I have explained to him that the standard of care since no polyps found at last visit may be different than it was 5 years ago but we will have the gastroenterologist make that decision. Stool was negative for blood #5 immunization update. Patient declined flu vaccination and pneumovax. He is willing to take the zoster vaccination because of knowing of people who've had trouble shingles but that is unavailable for him today hopefully next visit we can give it to him. Return in one year for PE should be noted EKG was done about 18 months ago and strongly recommended one baby aspirin a day.

## 2011-07-16 NOTE — Patient Instructions (Signed)
asprinPreventative Care for Adults, Male A healthy lifestyle and preventative care can promote health and wellness. Preventative health guidelines for women include the following key practices:  A routine yearly physical is a good way to check with your caregiver about your health and preventative screening. It is a chance to share any concerns and updates on your health, and to receive a thorough exam.   Visit your dentist for a routine exam and preventative care every 6 months. Brush your teeth twice a day and floss once a day. Good oral hygiene prevents tooth decay and gum disease.   The frequency of eye exams is based on your age, health, family medical history, use of contact lenses, and other factors. Follow your caregiver's recommendations for frequency of eye exams.   Eat a healthy diet. Foods like vegetables, fruits, whole grains, low-fat dairy products, and lean protein foods contain the nutrients you need without too many calories. Decrease your intake of foods high in solid fats, added sugars, and salt. Eat the right amount of calories for you.Get information about a proper diet from your caregiver, if necessary.   Regular physical exercise is one of the most important things you can do for your health. Most adults should get at least 150 minutes of moderate-intensity exercise (any activity that increases your heart rate and causes you to sweat) each week. In addition, most adults need muscle-strengthening exercises on 2 or more days a week.   Maintain a healthy weight. The body mass index (BMI) is a screening tool to identify possible weight problems. It provides an estimate of body fat based on height and weight. Your caregiver can help determine your BMI, and can help you achieve or maintain a healthy weight.For adults 20 years and older:   A BMI below 18.5 is considered underweight.   A BMI of 18.5 to 24.9 is normal.   A BMI of 25 to 29.9 is considered overweight.   A BMI of  30 and above is considered obese.   Maintain normal blood lipids and cholesterol levels by exercising and minimizing your intake of saturated fat. Eat a balanced diet with plenty of fruit and vegetables. Blood tests for lipids and cholesterol should begin at age 81 and be repeated every 5 years. If your lipid or cholesterol levels are high, you are over 50, or you are a high risk for heart disease, you may need your cholesterol levels checked more frequently.Ongoing high lipid and cholesterol levels should be treated with medicines if diet and exercise are not effective.   If you smoke, find out from your caregiver how to quit. If you do not use tobacco, do not start.   If you are pregnant, do not drink alcohol. If you are breastfeeding, be very cautious about drinking alcohol. If you are not pregnant and choose to drink alcohol, do not exceed 1 drink per day. One drink is considered to be 12 ounces (355 mL) of beer, 5 ounces (148 mL) of wine, or 1.5 ounces (44 mL) of liquor.   Avoid use of street drugs. Do not share needles with anyone. Ask for help if you need support or instructions about stopping the use of drugs.   High blood pressure causes heart disease and increases the risk of stroke. Your blood pressure should be checked at least every 1 to 2 years. Ongoing high blood pressure should be treated with medicines if weight loss and exercise are not effective.   If you are 55 to 63  years old, ask your caregiver if you should take aspirin to prevent strokes.   Diabetes screening involves taking a blood sample to check your fasting blood sugar level. This should be done once every 3 years, after age 45, if you are within normal weight and without risk factors for diabetes. Testing should be considered at a younger age or be carried out more frequently if you are overweight and have at least 1 risk factor for diabetes.   Breast cancer screening is essential preventative care for women. You should  practice "breast self-awareness." This means understanding the normal appearance and feel of your breasts and may include breast self-examination. Any changes detected, no matter how small, should be reported to a caregiver. Women in their 20s and 30s should have a clinical breast exam (CBE) by a caregiver as part of a regular health exam every 1 to 3 years. After age 40, women should have a CBE every year. Starting at age 40, women should consider having a mammogram (breast X-ray) every year. Women who have a family history of breast cancer should talk to their caregiver about genetic screening. Women at a high risk of breast cancer should talk to their caregiver about having an MRI and a mammogram every year.   The Pap test is a screening test for cervical cancer. A Pap test can show cell changes on the cervix that might become cervical cancer if left untreated. A Pap test is a procedure in which cells are obtained and examined from the lower end of the uterus (cervix).   Women should have a Pap test starting at age 21.   Between ages 21 and 29, Pap tests should be repeated every 2 years.   Beginning at age 30, you should have a Pap test every 3 years as long as the past 3 Pap tests have been normal.   Some women have medical problems that increase the chance of getting cervical cancer. Talk to your caregiver about these problems. It is especially important to talk to your caregiver if a new problem develops soon after your last Pap test. In these cases, your caregiver may recommend more frequent screening and Pap tests.   The above recommendations are the same for women who have or have not gotten the vaccine for human papillomavirus (HPV).   If you had a hysterectomy for a problem that was not cancer or a condition that could lead to cancer, then you no longer need Pap tests. Even if you no longer need a Pap test, a regular exam is a good idea to make sure no other problems are starting.   If you  are between ages 65 and 70, and you have had normal Pap tests going back 10 years, you no longer need Pap tests. Even if you no longer need a Pap test, a regular exam is a good idea to make sure no other problems are starting.   If you have had past treatment for cervical cancer or a condition that could lead to cancer, you need Pap tests and screening for cancer for at least 20 years after your treatment.   If Pap tests have been discontinued, risk factors (such as a new sexual partner) need to be reassessed to determine if screening should be resumed.   The HPV test is an additional test that may be used for cervical cancer screening. The HPV test looks for the virus that can cause the cell changes on the cervix. The cells collected   during the Pap test can be tested for HPV. The HPV test could be used to screen women aged 30 years and older, and should be used in women of any age who have unclear Pap test results. After the age of 30, women should have HPV testing at the same frequency as a Pap test.   Colorectal cancer can be detected and often prevented. Most routine colorectal cancer screening begins at the age of 50 and continues through age 75. However, your caregiver may recommend screening at an earlier age if you have risk factors for colon cancer. On a yearly basis, your caregiver may provide home test kits to check for hidden blood in the stool. Use of a small camera at the end of a tube, to directly examine the colon (sigmoidoscopy or colonoscopy), can detect the earliest forms of colorectal cancer. Talk to your caregiver about this at age 50, when routine screening begins. Direct examination of the colon should be repeated every 5 to 10 years through age 75, unless early forms of pre-cancerous polyps or small growths are found.   Practice safe sex. Use condoms and avoid high-risk sexual practices to reduce the spread of sexually transmitted infections (STIs). STIs include gonorrhea,  chlamydia, syphilis, trichomonas, herpes, HPV, and human immunodeficiency virus (HIV). Herpes, HIV, and HPV are viral illnesses that have no cure. They can result in disability, cancer, and death. Sexually active women aged 25 and younger should be checked for Chlamydia. Older women with new or multiple partners should also be tested for Chlamydia. Testing for other STIs is recommended if you are sexually active and at increased risk.   Osteoporosis is a disease in which the bones lose minerals and strength with aging. This can result in serious bone fractures. The risk of osteoporosis can be identified using a bone density scan. Women ages 65 and over and women at risk for fractures or osteoporosis should discuss screening with their caregivers. Ask your caregiver whether you should take a calcium supplement or vitamin D to reduce the rate of osteoporosis.   Menopause can be associated with physical symptoms and risks. Hormone replacement therapy is available to decrease symptoms and risks. You should talk to your caregiver about whether hormone replacement therapy is right for you.   Use sunscreen with skin protection factor (SPF) of 30 or more. Apply sunscreen liberally and repeatedly throughout the day. You should seek shade when your shadow is shorter than you. Protect yourself by wearing long sleeves, pants, a wide-brimmed hat, and sunglasses year round, whenever you are outdoors.   Once a month, do a whole body skin exam, using a mirror to look at the skin on your back. Notify your caregiver of new moles, moles that have irregular borders, moles that are larger than a pencil eraser, or moles that have changed in shape or color.   Stay current with required immunizations.   Influenza. You need a dose every fall (or winter). The composition of the flu vaccine changes each year, so being vaccinated once is not enough.   Pneumococcal polysaccharide. You need 1 to 2 doses if you smoke cigarettes or  if you have certain chronic medical conditions. You need 1 dose at age 65 (or older) if you have never been vaccinated.   Tetanus, diphtheria, pertussis (Tdap, Td). Get 1 dose of Tdap vaccine if you are younger than age 65 years, are over 65 and have contact with an infant, are a healthcare worker, are pregnant, or simply want   to be protected from whooping cough. After that, you need a Td booster dose every 10 years. Consult your caregiver if you have not had at least 3 tetanus and diphtheria-containing shots sometime in your life or have a deep or dirty wound.   HPV. You need this vaccine if you are a woman age 26 years or younger. The vaccine is given in 3 doses over 6 months.   Measles, mumps, rubella (MMR). You need at least 1 dose of MMR if you were born in 1957 or later. You may also need a 2nd dose.   Meningococcal. If you are age 19 to 21 years and a first-year college student living in a residence hall, or have one of several medical conditions, you need to get vaccinated against meningococcal disease. You may also need additional booster doses.   Zoster (shingles). If you are age 60 years or older, you should get this vaccine.   Varicella (chickenpox). If you have never had chickenpox or you were vaccinated but received only 1 dose, talk to your caregiver to find out if you need this vaccine.   Hepatitis A. You need this vaccine if you have a specific risk factor for hepatitis A virus infection or you simply wish to be protected from this disease. The vaccine is usually given as 2 doses, 6 to 18 months apart.   Hepatitis B. You need this vaccine if you have a specific risk factor for hepatitis B virus infection or you simply wish to be protected from this disease. The vaccine is given in 3 doses, usually over 6 months.  Preventative Services / Frequency Ages 19 to 39  Blood pressure check.** / Every 1 to 2 years.   Lipid and cholesterol check.**/ Every 5 years beginning at age 20.    Clinical breast exam.** / Every 3 years for women in their 20s and 30s.   Pap Test.** / Every 2 years from ages 21 through 29. Every 3 years starting at age 30 years through age 65 or 70 with a history of 3 consecutive normal Pap tests.   HPV Screening.** / Every 3 years from ages 30 through ages 65 to 70 with a history of 3 consecutive normal Pap tests.   Skin self-exam. / Monthly.   Influenza immunization.** / Every year.   Pneumococcal polysaccharide immunization.** / 1 to 2 doses if you smoke cigarettes or if you have certain chronic medical conditions.   Tetanus, diphtheria, pertussis (Tdap,Td) immunization. / A one-time dose of Tdap vaccine. After that, you need a Td booster dose every 10 years.   HPV immunization. / 3 doses over 6 months, if 26 and younger.   Measles, mumps, rubella (MMR) immunization. / You need at least 1 dose of MMR if you were born in 1957 or later. You may also need a 2nd dose.   Meningococcal immunization. / 1 dose if you are age 19 to 21 years and a first-year college student living in a residence hall, or have one of several medical conditions, you need to get vaccinated against meningococcal disease. You may also need additional booster doses.   Varicella immunization. **/ Consult your caregiver.   Hepatitis A immunization. ** / Consult your caregiver. 2 doses, 6 to 18 months apart.   Hepatitis B immunization.** / Consult your caregiver. 3 doses usually over 6 months.  Ages 40 to 64  Blood pressure check.** / Every 1 to 2 years.   Lipid and cholesterol check.**/ Every 5 years beginning   at age 20.   Clinical breast exam.** / Every year after age 40.   Mammogram.** / Every year beginning at age 40 and continuing for as long as you are in good health. Consult with your caregiver.   Pap Test.** / Every 3 years starting at age 30 years through age 65 or 70 with a history of 3 consecutive normal Pap tests.   HPV Screening.** / Every 3 years from  ages 30 through ages 65 to 70 with a history of 3 consecutive normal Pap tests.   Fecal occult blood test (FOBT) of stool. / Every year beginning at age 50 and continuing until age 75. You may not have to do this test if you get colonoscopy every 10 years.   Flexible sigmoidoscopy** or colonoscopy.** / Every 5 years for a flexible sigmoidoscopy or every 10 years for a colonoscopy beginning at age 50 and continuing until age 75.   Skin self-exam. / Monthly.   Influenza immunization.** / Every year.   Pneumococcal polysaccharide immunization.** / 1 to 2 doses if you smoke cigarettes or if you have certain chronic medical conditions.   Tetanus, diphtheria, pertussis (Tdap/Td) immunization.** / A one-time dose of Tdap vaccine. After that, you need a Td booster dose every 10 years.   Measles, mumps, rubella (MMR) immunization. / You need at least 1 dose of MMR if you were born in 1957 or later. You may also need a 2nd dose.   Varicella immunization. **/ Consult your caregiver.   Meningococcal immunization.** / Consult your caregiver.     Hepatitis A immunization. ** / Consult your caregiver. 2 doses, 6 to 18 months apart.   Hepatitis B immunization.** / Consult your caregiver. 3 doses, usually over 6 months.  Ages 65 and over  Blood pressure check.** / Every 1 to 2 years.   Lipid and cholesterol check.**/ Every 5 years beginning at age 20.   Clinical breast exam.** / Every year after age 40.   Mammogram.** / Every year beginning at age 40 and continuing for as long as you are in good health. Consult with your caregiver.   Pap Test,** / Every 3 years starting at age 30 years through age 65 or 70 with a 3 consecutive normal Pap tests. Testing can be stopped between 65 and 70 with 3 consecutive normal Pap tests and no abnormal Pap or HPV tests in the past 10 years.   HPV Screening.** / Every 3 years from ages 30 through ages 65 or 70 with a history of 3 consecutive normal Pap tests.  Testing can be stopped between 65 and 70 with 3 consecutive normal Pap tests and no abnormal Pap or HPV tests in the past 10 years.   Fecal occult blood test (FOBT) of stool. / Every year beginning at age 50 and continuing until age 75. You may not have to do this test if you get colonoscopy every 10 years.   Flexible sigmoidoscopy** or colonoscopy.** / Every 5 years for a flexible sigmoidoscopy or every 10 years for a colonoscopy beginning at age 50 and continuing until age 75.   Osteoporosis screening.** / A one-time screening for women ages 65 and over and women at risk for fractures or osteoporosis.   Skin self-exam. / Monthly.   Influenza immunization.** / Every year.   Pneumococcal polysaccharide immunization.** / 1 dose at age 65 (or older) if you have never been vaccinated.   Tetanus, diphtheria, pertussis (Tdap, Td) immunization. / A one-time dose of Tdap   vaccine if you are over 65 and have contact with an infant, are a healthcare worker, or simply want to be protected from whooping cough. After that, you need a Td booster dose every 10 years.   Varicella immunization. **/ Consult your caregiver.   Meningococcal immunization.** / Consult your caregiver.   Hepatitis A immunization. ** / Consult your caregiver. 2 doses, 6 to 18 months apart.   Hepatitis B immunization.** / Check with your caregiver. 3 doses, usually over 6 months.  ** Family history and personal history of risk and conditions may change your caregiver's recommendations. Document Released: 07/28/2001 Document Revised: 02/11/2011 Document Reviewed: 10/27/2010 [employer] Patient Information 2012 [employer]. 

## 2011-07-17 LAB — HEMOGLOBIN A1C
Hgb A1c MFr Bld: 6 % — ABNORMAL HIGH (ref <5.7)
Mean Plasma Glucose: 126 mg/dL — ABNORMAL HIGH (ref <117)

## 2011-07-21 ENCOUNTER — Encounter: Payer: Self-pay | Admitting: Family Medicine

## 2011-09-07 ENCOUNTER — Other Ambulatory Visit: Payer: Self-pay | Admitting: *Deleted

## 2011-09-07 ENCOUNTER — Encounter: Payer: Self-pay | Admitting: *Deleted

## 2011-09-07 DIAGNOSIS — Z0000 Encounter for general adult medical examination without abnormal findings: Secondary | ICD-10-CM

## 2011-09-07 MED ORDER — CARBAMAZEPINE 200 MG PO TABS: 200.0000 mg | ORAL_TABLET | Freq: Every day | ORAL | Status: DC

## 2011-11-05 ENCOUNTER — Encounter: Payer: Self-pay | Admitting: Family Medicine

## 2011-11-05 ENCOUNTER — Ambulatory Visit (INDEPENDENT_AMBULATORY_CARE_PROVIDER_SITE_OTHER): Payer: PRIVATE HEALTH INSURANCE | Admitting: Family Medicine

## 2011-11-05 VITALS — BP 127/79 | HR 63 | Ht 71.5 in | Wt 185.0 lb

## 2011-11-05 DIAGNOSIS — M719 Bursopathy, unspecified: Secondary | ICD-10-CM

## 2011-11-05 DIAGNOSIS — M67919 Unspecified disorder of synovium and tendon, unspecified shoulder: Secondary | ICD-10-CM

## 2011-11-05 DIAGNOSIS — M778 Other enthesopathies, not elsewhere classified: Secondary | ICD-10-CM

## 2011-11-05 DIAGNOSIS — M25511 Pain in right shoulder: Secondary | ICD-10-CM

## 2011-11-05 MED ORDER — MELOXICAM 15 MG PO TABS: 15.0000 mg | ORAL_TABLET | Freq: Every day | ORAL | Status: DC

## 2011-11-05 NOTE — Progress Notes (Signed)
  Subjective:    Patient ID: Robert Hodges, male    DOB: 07-Jan-1951, 61 y.o.   MRN: [id number]  Shoulder Pain  The pain is present in the right shoulder. This is a recurrent problem. The current episode started more than 1 year ago (about 3 years on going off and on). There has been no history of extremity trauma. The problem occurs 2 to 4 times per day. The problem has been gradually worsening. The quality of the pain is described as aching. The pain is moderate. Associated symptoms include a limited range of motion. Associated symptoms comments: Inability to raise arm all the way up and down. The symptoms are aggravated by activity. He has tried nothing for the symptoms. Family history does not include gout or rheumatoid arthritis. There is no history of diabetes, gout, osteoarthritis or rheumatoid arthritis.      Review of Systems  Musculoskeletal: Positive for myalgias and arthralgias. Negative for gout.      BP 127/79  Pulse 63  Wt 185 lb (83.915 kg) Objective:   Physical Exam  Constitutional: He appears well-developed and well-nourished. No distress.  Neck: Normal range of motion. Neck supple.  Musculoskeletal: He exhibits tenderness.       Patient did have some anterior pinpoint tenderness over the right shoulder but unable to really pinpoint one particular spot. He is able to raise his right arm completely over his head.  Neurological: He is alert.  Skin: Skin is warm and dry. No erythema.  Psychiatric: He has a normal mood and affect.      Assessment & Plan:  Tendinitis right shoulder pain. We'll get him started in physical therapy and well placed on Mobic 15 mg one tablet a day.) See him back in 2 months if 1 particular area becomes tender and localized we'll bring him back for possible cortisone injection before that.

## 2011-11-05 NOTE — Patient Instructions (Signed)
Tendinitis  Tendinitis is swelling and inflammation of the tendons. Tendons are band-like tissues that connect muscle to bone. Tendinitis commonly occurs in the:    Shoulders (rotator cuff).   Heels (Achilles tendon).   Elbows (triceps tendon).  CAUSES  Tendinitis is usually caused by overusing the tendon, muscles, and joints involved. When the tissue surrounding a tendon (synovium) becomes inflamed, it is called tenosynovitis. Tendinitis commonly develops in people whose jobs require repetitive motions.  SYMPTOMS   Pain.   Tenderness.   Mild swelling.  DIAGNOSIS  Tendinitis is usually diagnosed by physical exam. Your caregiver may also order X-rays or other imaging tests.  TREATMENT  Your caregiver may recommend certain medicines or exercises for your treatment.  HOME CARE INSTRUCTIONS    Use a sling or splint for as long as directed by your caregiver until the pain decreases.   Put ice on the injured area.   Put ice in a plastic bag.   Place a towel between your skin and the bag.   Leave the ice on for 15 to 20 minutes, 3 to 4 times a day.   Avoid using the limb while the tendon is painful. Perform gentle range of motion exercises only as directed by your caregiver. Stop exercises if pain or discomfort increase, unless directed otherwise by your caregiver.   Only take over-the-counter or prescription medicines for pain, discomfort, or fever as directed by your caregiver.  SEEK MEDICAL CARE IF:    Your pain and swelling increase.   You develop new, unexplained symptoms, especially increased numbness in the hands.  MAKE SURE YOU:    Understand these instructions.   Will watch your condition.   Will get help right away if you are not doing well or get worse.  Document Released: 05/29/2000 Document Revised: 05/21/2011 Document Reviewed: 08/18/2010  ExitCare Patient Information 2012 [employer].

## 2011-11-12 ENCOUNTER — Ambulatory Visit: Payer: PRIVATE HEALTH INSURANCE | Attending: Family Medicine | Admitting: Physical Therapy

## 2011-11-12 DIAGNOSIS — R293 Abnormal posture: Secondary | ICD-10-CM | POA: Insufficient documentation

## 2011-11-12 DIAGNOSIS — M6281 Muscle weakness (generalized): Secondary | ICD-10-CM | POA: Insufficient documentation

## 2011-11-12 DIAGNOSIS — M25519 Pain in unspecified shoulder: Secondary | ICD-10-CM | POA: Insufficient documentation

## 2011-11-12 DIAGNOSIS — IMO0001 Reserved for inherently not codable concepts without codable children: Secondary | ICD-10-CM | POA: Insufficient documentation

## 2011-11-16 ENCOUNTER — Ambulatory Visit: Payer: PRIVATE HEALTH INSURANCE | Attending: Family Medicine | Admitting: Physical Therapy

## 2011-11-16 DIAGNOSIS — IMO0001 Reserved for inherently not codable concepts without codable children: Secondary | ICD-10-CM | POA: Insufficient documentation

## 2011-11-16 DIAGNOSIS — M6281 Muscle weakness (generalized): Secondary | ICD-10-CM | POA: Insufficient documentation

## 2011-11-16 DIAGNOSIS — R293 Abnormal posture: Secondary | ICD-10-CM | POA: Insufficient documentation

## 2011-11-16 DIAGNOSIS — M25519 Pain in unspecified shoulder: Secondary | ICD-10-CM | POA: Insufficient documentation

## 2011-11-19 ENCOUNTER — Ambulatory Visit: Payer: PRIVATE HEALTH INSURANCE | Admitting: Physical Therapy

## 2011-11-23 ENCOUNTER — Ambulatory Visit: Payer: PRIVATE HEALTH INSURANCE | Admitting: Physical Therapy

## 2011-11-26 ENCOUNTER — Ambulatory Visit: Payer: PRIVATE HEALTH INSURANCE | Admitting: Physical Therapy

## 2011-11-30 ENCOUNTER — Ambulatory Visit: Payer: PRIVATE HEALTH INSURANCE | Admitting: Physical Therapy

## 2011-12-03 ENCOUNTER — Ambulatory Visit: Payer: PRIVATE HEALTH INSURANCE | Admitting: Physical Therapy

## 2011-12-07 ENCOUNTER — Ambulatory Visit: Payer: PRIVATE HEALTH INSURANCE | Admitting: Physical Therapy

## 2011-12-10 ENCOUNTER — Ambulatory Visit: Payer: PRIVATE HEALTH INSURANCE | Admitting: Physical Therapy

## 2012-01-05 ENCOUNTER — Encounter: Payer: Self-pay | Admitting: Family Medicine

## 2012-01-05 ENCOUNTER — Ambulatory Visit (INDEPENDENT_AMBULATORY_CARE_PROVIDER_SITE_OTHER): Payer: PRIVATE HEALTH INSURANCE | Admitting: Family Medicine

## 2012-01-05 VITALS — BP 116/69 | HR 66 | Ht 71.5 in | Wt 185.0 lb

## 2012-01-05 DIAGNOSIS — M25511 Pain in right shoulder: Secondary | ICD-10-CM

## 2012-01-05 DIAGNOSIS — M25519 Pain in unspecified shoulder: Secondary | ICD-10-CM

## 2012-01-05 MED ORDER — MELOXICAM 15 MG PO TABS: 15.0000 mg | ORAL_TABLET | Freq: Every day | ORAL | Status: DC

## 2012-01-05 NOTE — Progress Notes (Signed)
  Subjective:    Patient ID: Robert Hodges, male    DOB: [birth year], 61 y.o.   MRN: [id number]  Shoulder Pain    patient is here for followup of shoulder pain. He reports vigorous physical therapy for some time cause discomfort and was not always easy but he does report increased improvement in use of the shoulder and arm. He states that life is much better now. He has much improved range of motion some difficulty pain at night but otherwise the pain is essentially gone except when he overuses his shoulder. He states his some of the exercises given him that he does a home he is found some discomfort when he does some everyday but when he does some every other day things are better.    Review of Systems  All other systems reviewed and are negative.      BP 116/69  Pulse 66  Ht 5' 11.5" (1.816 m)  Wt 185 lb (83.915 kg)  BMI 25.44 kg/m2  SpO2 98% Objective:   Physical Exam  Constitutional: He is oriented to person, place, and time. He appears well-developed and well-nourished.  HENT:  Head: Normocephalic and atraumatic.  Neck: Normal range of motion. Neck supple.  Musculoskeletal: Normal range of motion. He exhibits no tenderness.  Neurological: He is alert and oriented to person, place, and time. No cranial nerve deficit.  Skin: Skin is warm and dry.  Psychiatric: He has a normal mood and affect. His behavior is normal.      Assessment & Plan:   #1 Shoulder pain marked improved continue physical therapy at home. I do not think injection is needed at this time. Use the Mobic on a when necessary basis. Continued exercising on a regular basis. Return in February for follow up and physical.

## 2012-01-05 NOTE — Patient Instructions (Signed)
Continue with PE exercise. Return for PE.

## 2012-07-26 ENCOUNTER — Encounter: Payer: Self-pay | Admitting: Family Medicine

## 2012-07-26 ENCOUNTER — Encounter: Payer: PRIVATE HEALTH INSURANCE | Admitting: Sports Medicine

## 2012-07-26 ENCOUNTER — Ambulatory Visit (INDEPENDENT_AMBULATORY_CARE_PROVIDER_SITE_OTHER): Payer: PRIVATE HEALTH INSURANCE | Admitting: Family Medicine

## 2012-07-26 VITALS — BP 137/75 | HR 83 | Ht 71.5 in | Wt 194.0 lb

## 2012-07-26 DIAGNOSIS — R569 Unspecified convulsions: Secondary | ICD-10-CM

## 2012-07-26 DIAGNOSIS — R739 Hyperglycemia, unspecified: Secondary | ICD-10-CM

## 2012-07-26 DIAGNOSIS — Z0000 Encounter for general adult medical examination without abnormal findings: Secondary | ICD-10-CM

## 2012-07-26 DIAGNOSIS — R7309 Other abnormal glucose: Secondary | ICD-10-CM

## 2012-07-26 DIAGNOSIS — Z79899 Other long term (current) drug therapy: Secondary | ICD-10-CM

## 2012-07-26 MED ORDER — CARBAMAZEPINE 200 MG PO TABS: 200.0000 mg | ORAL_TABLET | Freq: Every day | ORAL | Status: DC

## 2012-07-26 NOTE — Progress Notes (Signed)
CC: Robert Hodges is a 62 y.o. male is here for f/u medications   Subjective: HPI:  Patient presents for followup of seizure disorder. He has been on Tegretol for almost a decade now per his report. He has not had a seizure in over a decade. He has not had any motor or sensory disturbances for years. He has absolutely no complaints today. He denies vision problems, weakness, hallucinations, hearing loss, mental disturbance, suicidality, depression, anxiety.  Chart review reveals last A1c of 6.0. He denies polyuria polyphasia or polydipsia. He is trying to be more active but no changes to his already healthy varied diet.  Chart review shows hyperlipidemia with an LDL of 132 one year ago. Patient denies chest pain, limb claudication, irregular heartbeat, shortness of breath, nor peripheral edema.   Review Of Systems Outlined In HPI  Past Medical History  Diagnosis Date  . Seizure disorder      No family history on file.   History  Substance Use Topics  . Smoking status: Former Games developer  . Smokeless tobacco: Never Used  . Alcohol Use: Not on file     Objective: Filed Vitals:   07/26/12 0827  BP: 137/75  Pulse: 83    General: Alert and Oriented, No Acute Distress HEENT: Pupils equal, round, reactive to light. Conjunctivae clear.  Moist mucous membranes Lungs: Clear to auscultation bilaterally, no wheezing/ronchi/rales.  Comfortable work of breathing. Good air movement. Cardiac: Regular rate and rhythm. Normal S1/S2.  No murmurs, rubs, nor gallops.   Abdomen: Soft nontender Neuro: CN II-XII grossly intact, full strength/rom of all four extremities, C5/L4 DTRs 2/4 bilaterally, gait normal Extremities: No peripheral edema.  Strong peripheral pulses.  Mental Status: No depression, anxiety, nor agitation. Skin: Warm and dry.  Assessment & Plan: Robert Hodges was seen today for f/u medications.  Diagnoses and associated orders for this visit:  SEIZURE DISORDER - Carbamazepine Level  (Tegretol), total - carbamazepine (TEGRETOL) 200 MG tablet; Take 1 tablet (200 mg total) by mouth daily.  High risk medication use - Carbamazepine Level (Tegretol), total  Hyperglycemia  Routine general medical examination at a health care facility    Seizure disorder: We will obtain a Tegretol level, I strongly encouraged him to have blood work checking BUN, liver function, and CBC however patient adamantly declines after risks and benefits were discussed Hyperglycemia: Encouraged patient had A1c repeated however he adamantly declines wanting to know even after benefits of controlled blood sugars were discussed Hyperlipidemia: Encouraged patient to have fasting lipid panel however he adamantly declines after discussion of risks of controlled hyperlipidemia.  Return in about 3 months (around 10/23/2012).

## 2012-07-27 LAB — CARBAMAZEPINE LEVEL, TOTAL: Carbamazepine Lvl: 4.2 ug/mL (ref 4.0–12.0)

## 2013-02-07 ENCOUNTER — Ambulatory Visit (INDEPENDENT_AMBULATORY_CARE_PROVIDER_SITE_OTHER): Payer: BC Managed Care – PPO | Admitting: Family Medicine

## 2013-02-07 ENCOUNTER — Encounter: Payer: Self-pay | Admitting: Family Medicine

## 2013-02-07 VITALS — BP 132/85 | HR 74 | Wt 196.0 lb

## 2013-02-07 DIAGNOSIS — L0291 Cutaneous abscess, unspecified: Secondary | ICD-10-CM

## 2013-02-07 DIAGNOSIS — L0390 Cellulitis, unspecified: Secondary | ICD-10-CM

## 2013-02-07 MED ORDER — SULFAMETHOXAZOLE-TRIMETHOPRIM 800-160 MG PO TABS: ORAL_TABLET | ORAL | Status: AC

## 2013-02-07 NOTE — Progress Notes (Signed)
CC: Robert Hodges is a 62 y.o. male is here for stung on rt calf   Subjective: HPI:  Patient complains of rash on the right calf that has been present for approximately 10 days it is described as red warm and itchy mild to moderate severity. Symptoms seem to be worsening on a daily basis. He reports feeling that he got stung while doing yard work to Saturdays ago, interventions have included bleach to the site of the sting, no other interventions since. Nothing particularly makes symptoms better or worse. Denies fevers, chills, myalgias, peripheral edema, nausea, rapid heartbeat, nor joint pain. Denies shortness of breath or swelling of the face or throat   Review Of Systems Outlined In HPI  Past Medical History  Diagnosis Date  . Seizure disorder      No family history on file.   History  Substance Use Topics  . Smoking status: Former Games developer  . Smokeless tobacco: Never Used  . Alcohol Use: Not on file     Objective: Filed Vitals:   02/07/13 1024  BP: 132/85  Pulse: 74    General: Alert and Oriented, No Acute Distress HEENT: Pupils equal, round, reactive to light. Conjunctivae clear.  Moist mucous membranes pharynx unremarkable Lungs: Clear comfortable work of breathing Cardiac: Regular rate and rhythm. Extremities: No peripheral edema.  Strong peripheral pulses. On the right medial calf there is a 10 cm x 7 cm approximately patch of erythema with mild central clearing and a 2 mm scab in the center, this it is warm nontender nonfluctuant without induration Mental Status: No depression, anxiety, nor agitation. Skin: Warm and dry.  Assessment & Plan: Robert Hodges was seen today for stung on rt calf.  Diagnoses and associated orders for this visit:  Cellulitis - sulfamethoxazole-trimethoprim (SEPTRA DS) 800-160 MG per tablet; One by mouth twice a day for ten days.    Discussed my suspicion of strep or staph cellulitis due to insect sting jeopardizing the integrity of skin  start Bactrim region of erythema outlined If redness remains outside of the perimeter 48 hours after starting antibiotic notify me ASAP   Return if symptoms worsen or fail to improve.

## 2013-04-20 ENCOUNTER — Other Ambulatory Visit: Payer: Self-pay

## 2013-08-15 ENCOUNTER — Encounter: Payer: Self-pay | Admitting: Family Medicine

## 2013-08-15 ENCOUNTER — Ambulatory Visit (INDEPENDENT_AMBULATORY_CARE_PROVIDER_SITE_OTHER): Payer: BC Managed Care – PPO | Admitting: Family Medicine

## 2013-08-15 VITALS — BP 135/82 | HR 69 | Wt 197.0 lb

## 2013-08-15 DIAGNOSIS — R7301 Impaired fasting glucose: Secondary | ICD-10-CM

## 2013-08-15 DIAGNOSIS — E785 Hyperlipidemia, unspecified: Secondary | ICD-10-CM

## 2013-08-15 DIAGNOSIS — Z79899 Other long term (current) drug therapy: Secondary | ICD-10-CM

## 2013-08-15 DIAGNOSIS — R569 Unspecified convulsions: Secondary | ICD-10-CM

## 2013-08-15 NOTE — Progress Notes (Signed)
CC: Robert Hodges is a 63 y.o. male is here for f/u med   Subjective: HPI:  Followup seizure disorder: Patient is requesting refill on Tegretol. Since I saw him last he denies any seizure-like activity he believes his last seizure was sometime between 78 and 14 years ago. He denies missed doses of Tegretol. He denies any difficulty with bleeding, trouble fighting infections, or bruising.  He denies any motor or sensory disturbances recently or remotely since I saw him last   He carries a remote diagnosis of impaired fasting glucose: Since his diagnosis he had significantly changed his diet increasing vegetables and cutting down on carbohydrates. He denies polyuria polyphasia or polydipsia.  He carries a remote diagnosis of hyperlipidemia since this diagnosis was given he engaged in diet interventions above along with cutting out all fried foods and minimizing saturated fat intake. He states that even if his cholesterol remains elevated he would never take any medication to control cholesterol     Review Of Systems Outlined In HPI  Past Medical History  Diagnosis Date  . Seizure disorder     Past Surgical History  Procedure Laterality Date  . Pudendal nerve  pudendal nerve     surgery   No family history on file.  History   Social History  . Marital Status: Married    Spouse Name: N/A    Number of Children: N/A  . Years of Education: N/A   Occupational History  . Not on file.   Social History Main Topics  . Smoking status: Former Research scientist (life sciences)  . Smokeless tobacco: Never Used  . Alcohol Use: Not on file  . Drug Use: No  . Sexual Activity: Not Currently    Partners: Female   Other Topics Concern  . Not on file   Social History Narrative  . No narrative on file     Objective: BP 135/82  Pulse 69  Wt 197 lb (89.359 kg)  General: Alert and Oriented, No Acute Distress HEENT: Pupils equal, round, reactive to light. Conjunctivae clear.   moist membranes pharynx  unremarkable  Neuro: CN II-XII grossly intact, full strength/rom of all four extremities,  gait normal, rapid alternating movements normal Lungs: Clear to auscultation bilaterally, no wheezing/ronchi/rales.  Comfortable work of breathing. Good air movement. Cardiac: Regular rate and rhythm. Normal S1/S2.  No murmurs, rubs, nor gallops.   Extremities: No peripheral edema.  Strong peripheral pulses.  Mental Status: No depression, anxiety, nor agitation. Skin: Warm and dry.  Assessment & Plan: Makar was seen today for f/u med.  Diagnoses and associated orders for this visit:  SEIZURE DISORDER - Carbamazepine Level (Tegretol), total  Impaired fasting glucose  DYSLIPIDEMIA  High risk medication use - Carbamazepine Level (Tegretol), total    Seizure disorder: Stable he is agreed to get a Tegretol level prior to refill however declines CBC with differential, complete metabolic panel after we discussed the side effects may not be apparent based on subjective feelings, he politely declines any monitoring of the above.  Impaired fasting glucose: He politely declines check an A1c today stating he is aware of the risks of uncontrolled hyperglycemia   hyperlipidemia: Politely declines lipid panel stating he would not take any medication or change his diet if his cholesterol is still elevated.    Return in about 1 year (around 08/16/2014).

## 2013-08-16 ENCOUNTER — Telehealth: Payer: Self-pay | Admitting: Family Medicine

## 2013-08-16 DIAGNOSIS — R569 Unspecified convulsions: Secondary | ICD-10-CM

## 2013-08-16 LAB — CARBAMAZEPINE LEVEL, TOTAL: Carbamazepine Lvl: 5 ug/mL (ref 4.0–12.0)

## 2013-08-16 MED ORDER — CARBAMAZEPINE 200 MG PO TABS: 200.0000 mg | ORAL_TABLET | Freq: Every day | ORAL | Status: DC

## 2013-08-16 NOTE — Telephone Encounter (Signed)
Refill

## 2013-12-27 ENCOUNTER — Telehealth: Payer: Self-pay

## 2013-12-27 NOTE — Telephone Encounter (Signed)
Kristen from [facility] called and just want to FYI,the pt medication Carbamazepine 20 mg 1 po q day has been manufactured by [employer] and will now be manufactured by [employer]. The pharmacist stated that she just wanted to let you know just incase the pt labs came up different./Croy Drumwright,CMA

## 2013-12-31 ENCOUNTER — Encounter: Payer: Self-pay | Admitting: Family Medicine

## 2014-01-12 ENCOUNTER — Emergency Department
Admission: EM | Admit: 2014-01-12 | Discharge: 2014-01-12 | Disposition: A | Discharge status: Home / Self Care | Payer: Medicare Other | Source: Home / Self Care | Attending: Family Medicine | Admitting: Family Medicine

## 2014-01-12 ENCOUNTER — Encounter: Payer: Self-pay | Admitting: Emergency Medicine

## 2014-01-12 DIAGNOSIS — T63443A Toxic effect of venom of bees, assault, initial encounter: Secondary | ICD-10-CM

## 2014-01-12 DIAGNOSIS — T6391XA Toxic effect of contact with unspecified venomous animal, accidental (unintentional), initial encounter: Secondary | ICD-10-CM

## 2014-01-12 DIAGNOSIS — Z23 Encounter for immunization: Secondary | ICD-10-CM

## 2014-01-12 MED ORDER — TETANUS-DIPHTH-ACELL PERTUSSIS 5-2.5-18.5 LF-MCG/0.5 IM SUSP
0.5000 mL | Freq: Once | INTRAMUSCULAR | Status: AC
  Administered 2014-01-12: 0.5 mL via INTRAMUSCULAR

## 2014-01-12 MED ORDER — DOXYCYCLINE HYCLATE 100 MG PO CAPS: 100.0000 mg | ORAL_CAPSULE | Freq: Two times a day (BID) | ORAL | Status: DC

## 2014-01-12 NOTE — Discharge Instructions (Signed)
Apply cool compress several times daily for 1 to 2 days.  Take Benadryl $RemoveBefor'50mg'[id number]$  at bedtime.   Bee, Wasp, or Hornet Sting Your caregiver has diagnosed you as having an insect sting. An insect sting appears as a red lump in the skin that sometimes has a tiny hole in the center, or it may have a stinger in the center of the wound. The most common stings are from wasps, hornets and bees. Individuals have different reactions to insect stings.  A normal reaction may cause pain, swelling, and redness around the sting site.  A localized allergic reaction may cause swelling and redness that extends beyond the sting site.  A large local reaction may continue to develop over the next 12 to 36 hours.  On occasion, the reactions can be severe (anaphylactic reaction). An anaphylactic reaction may cause wheezing; difficulty breathing; chest pain; fainting; raised, itchy, red patches on the skin; a sick feeling to your stomach (nausea); vomiting; cramping; or diarrhea. If you have had an anaphylactic reaction to an insect sting in the past, you are more likely to have one again. HOME CARE INSTRUCTIONS   With bee stings, a small sac of poison is left in the wound. Brushing across this with something such as a credit card, or anything similar, will help remove this and decrease the amount of the reaction. This same procedure will not help a wasp sting as they do not leave behind a stinger and poison sac.  Apply a cold compress for 10 to 20 minutes every hour for 1 to 2 days, depending on severity, to reduce swelling and itching.  To lessen pain, a paste made of water and baking soda may be rubbed on the bite or sting and left on for 5 minutes.  To relieve itching and swelling, you may use take medication or apply medicated creams or lotions as directed.  Only take over-the-counter or prescription medicines for pain, discomfort, or fever as directed by your caregiver.  Wash the sting site daily with soap and  water. Apply antibiotic ointment on the sting site as directed.  If you suffered a severe reaction:  If you did not require hospitalization, an adult will need to stay with you for 24 hours in case the symptoms return.  You may need to wear a medical bracelet or necklace stating the allergy.  You and your family need to learn when and how to use an anaphylaxis kit or epinephrine injection.  If you have had a severe reaction before, always carry your anaphylaxis kit with you. SEEK MEDICAL CARE IF:   None of the above helps within 2 to 3 days.  The area becomes red, warm, tender, and swollen beyond the area of the bite or sting.  You have an oral temperature above 102 F (38.9 C). SEEK IMMEDIATE MEDICAL CARE IF:  You have symptoms of an allergic reaction which are:  Wheezing.  Difficulty breathing.  Chest pain.  Lightheadedness or fainting.  Itchy, raised, red patches on the skin.  Nausea, vomiting, cramping or diarrhea. ANY OF THESE SYMPTOMS MAY REPRESENT A SERIOUS PROBLEM THAT IS AN EMERGENCY. Do not wait to see if the symptoms will go away. Get medical help right away. Call your local emergency services (911 in [place] DO NOT drive yourself to the hospital. MAKE SURE YOU:   Understand these instructions.  Will watch your condition.  Will get help right away if you are not doing well or get worse. Document Released: 06/01/2005 Document Revised:  08/24/2011 Document Reviewed: 11/16/2009 °ExitCare® Patient Information ©2015 [employer]. This information is not intended to replace advice given to you by your health care provider. Make sure you discuss any questions you have with your health care provider. ° °

## 2014-01-12 NOTE — ED Provider Notes (Signed)
CSN: [id number]     Arrival date & time 01/12/14  1854 History   First MD Initiated Contact with Patient 01/12/14 1916     Chief Complaint  Patient presents with  . Insect Bite      HPI Comments: Patient states that he was stung by bees at 1pm today on his left ankle and right forearm.  No shortness of breath or difficulty swallowing.  No hives.  He is not sure about his last tetanus shot. He states that the sting site on his right forearm has become unusually red.  The history is provided by the patient.    Past Medical History  Diagnosis Date  . Seizure disorder    Past Surgical History  Procedure Laterality Date  . Pudendal nerve  pudendal nerve     surgery   History reviewed. No pertinent family history. History  Substance Use Topics  . Smoking status: Former Research scientist (life sciences)  . Smokeless tobacco: Never Used  . Alcohol Use: Not on file    Review of Systems  Constitutional: Negative for fever, chills and fatigue.  HENT: Negative for trouble swallowing.   Respiratory: Negative for chest tightness, shortness of breath, wheezing and stridor.   Cardiovascular: Negative.   Gastrointestinal: Negative.   Genitourinary: Negative.   Musculoskeletal: Negative.   Skin: Positive for rash.  Neurological: Negative for light-headedness and headaches.  All other systems reviewed and are negative.   Allergies  Review of patient's allergies indicates no known allergies.  Home Medications   Prior to Admission medications   Medication Sig Start Date End Date Taking? Authorizing Provider  carbamazepine (TEGRETOL) 200 MG tablet Take 1 tablet (200 mg total) by mouth daily. 08/16/13   Marcial Pacas, DO  doxycycline (VIBRAMYCIN) 100 MG capsule Take 1 capsule (100 mg total) by mouth 2 (two) times daily. 01/12/14   Kandra Nicolas, MD   BP 125/79  Pulse 90  Temp(Src) 98.2 F (36.8 C) (Oral)  Resp 18  Wt 190 lb (86.183 kg)  SpO2 98% Physical Exam  Nursing note and vitals  reviewed. Constitutional: He is oriented to person, place, and time. He appears well-developed and well-nourished. No distress.  HENT:  Head: Normocephalic.  Eyes: Conjunctivae are normal. Pupils are equal, round, and reactive to light.  Cardiovascular: Normal heart sounds.   Pulmonary/Chest: Breath sounds normal.  Musculoskeletal: He exhibits no edema.       Arms:      Legs: Left lower anterior leg has a 5cm by 6cm patch of macular erythema as noted on diagram.  Right forearm has a 6cm by 8cm patch of macular erythema with well defined border as noted on diagram.  Area is warm but nontender.      Lymphadenopathy:    He has no cervical adenopathy.  Neurological: He is alert and oriented to person, place, and time.  Skin: Skin is warm and dry. Rash noted.    ED Course  Procedures  none      MDM   1. Bee sting reaction, assault, initial encounter; lesion right forearm has appearance of early cellulitis                                               Tdap administered Begin doxycycline for staph coverage. Apply cool compress several times daily for 1 to 2 days.  Take Benadryl 50mg  at bedtime.  Followup with Family Doctor if not improved in one week.     Kandra Nicolas, MD 01/16/14 [phone]

## 2014-01-12 NOTE — ED Notes (Signed)
Pt c/o bee sting to his RT forearm and LT lower leg x 1300 today. He applied bleach, then rinsed.

## 2014-09-20 ENCOUNTER — Telehealth: Payer: Self-pay | Admitting: Family Medicine

## 2014-09-20 DIAGNOSIS — G40909 Epilepsy, unspecified, not intractable, without status epilepticus: Secondary | ICD-10-CM

## 2014-09-20 NOTE — Telephone Encounter (Signed)
Pt pharmacy sent request for refill on Tegretol 200mg . I attempted to refill this medication and the system states a different medication must be selected. Please advise.

## 2014-09-21 ENCOUNTER — Encounter: Payer: Self-pay | Admitting: Family Medicine

## 2014-09-21 ENCOUNTER — Ambulatory Visit (INDEPENDENT_AMBULATORY_CARE_PROVIDER_SITE_OTHER): Payer: Medicare Other | Admitting: Family Medicine

## 2014-09-21 VITALS — BP 121/76 | HR 72 | Wt 184.0 lb

## 2014-09-21 DIAGNOSIS — G40909 Epilepsy, unspecified, not intractable, without status epilepticus: Secondary | ICD-10-CM | POA: Diagnosis not present

## 2014-09-21 MED ORDER — CARBAMAZEPINE 200 MG PO TABS: 200.0000 mg | ORAL_TABLET | Freq: Every day | ORAL | Status: DC

## 2014-09-21 NOTE — Progress Notes (Signed)
CC: Robert Hodges is a 63 y.o. male is here for f/u medication   Subjective: HPI:  Follow-up seizure disorder: Since I saw him last the manufacturer of Tegretol has changed he continues to take 200 mg of this medication on a daily basis without any recent motor or sensory disturbances. He tells me he's been taking this dose for over 12 years and levels have always been within therapeutic range. Denies any headache, vision loss, confusion, weakness, numbness or coordination difficulty.   Review Of Systems Outlined In HPI  Past Medical History  Diagnosis Date  . Seizure disorder     Past Surgical History  Procedure Laterality Date  . Pudendal nerve  pudendal nerve     surgery   No family history on file.  History   Social History  . Marital Status: Married    Spouse Name: N/A  . Number of Children: N/A  . Years of Education: N/A   Occupational History  . Not on file.   Social History Main Topics  . Smoking status: Former Research scientist (life sciences)  . Smokeless tobacco: Never Used  . Alcohol Use: Not on file  . Drug Use: No  . Sexual Activity:    Partners: Female   Other Topics Concern  . Not on file   Social History Narrative     Objective: BP 121/76 mmHg  Pulse 72  Wt 184 lb (83.462 kg)  General: Alert and Oriented, No Acute Distress HEENT: Pupils equal, round, reactive to light. Conjunctivae clear.  Moist mucous membranes Cranial nerves II through XII grossly intact Lungs: Clear to auscultation bilaterally, no wheezing/ronchi/rales.  Comfortable work of breathing. Good air movement. Cardiac: Regular rate and rhythm. Normal S1/S2.  No murmurs, rubs, nor gallops.   Extremities: No peripheral edema.  Strong peripheral pulses.  Mental Status: No depression, anxiety, nor agitation. Skin: Warm and dry.  Assessment & Plan: Robert Hodges was seen today for f/u medication.  Diagnoses and all orders for this visit:  Seizure disorder Orders: -     Carbamazepine Level (Tegretol),  total   Seizure disorder: Stable, since the manufacturer of his carbamazepine has changed recently will check levels today and if normal offer to space out these check since his historically been in the therapeutic range annually.  Return if symptoms worsen or fail to improve.

## 2014-09-21 NOTE — Telephone Encounter (Signed)
Reubin Milan, New Rx sent to [facility].  It was a problem with the diagnosis and not the medication.

## 2014-09-22 LAB — CARBAMAZEPINE LEVEL, TOTAL: Carbamazepine Lvl: 4 ug/mL (ref 4.0–12.0)

## 2014-10-11 ENCOUNTER — Encounter: Payer: Self-pay | Admitting: Family Medicine

## 2014-10-11 ENCOUNTER — Ambulatory Visit (INDEPENDENT_AMBULATORY_CARE_PROVIDER_SITE_OTHER): Payer: Medicare Other | Admitting: Family Medicine

## 2014-10-11 DIAGNOSIS — R55 Syncope and collapse: Secondary | ICD-10-CM | POA: Diagnosis not present

## 2014-10-11 NOTE — Progress Notes (Signed)
EKG with sinus bradycardia, pulse rate of 59-65 while sedated at dentist office. No further intervention needed.

## 2014-10-11 NOTE — Progress Notes (Signed)
   Subjective:    Patient ID: Robert Hodges, male    DOB: 08/07/1950, 64 y.o.   MRN: [id number]  HPI  Robert Hodges had a vasovagal response while having oral surgery under sedation. EKG was recommended.    Review of Systems     Objective:   Physical Exam        Assessment & Plan:  Loss of consciousness - EKG preformed - Sinus bradycardia - otherwise normal.

## 2015-09-25 ENCOUNTER — Telehealth: Payer: Self-pay | Admitting: Family Medicine

## 2015-09-25 DIAGNOSIS — K033 Pathological resorption of teeth: Secondary | ICD-10-CM

## 2015-09-25 NOTE — Telephone Encounter (Signed)
Pt.notified

## 2015-09-25 NOTE — Telephone Encounter (Signed)
Will you please let patient know that Dr. Jetta Lout has asked for [place] to check for a parathryoid abnormality and this can be accomplished with getting lab work done that [employer] out.

## 2015-10-01 ENCOUNTER — Telehealth: Payer: Self-pay | Admitting: Family Medicine

## 2015-10-01 ENCOUNTER — Encounter: Payer: Self-pay | Admitting: Family Medicine

## 2015-10-01 DIAGNOSIS — N2581 Secondary hyperparathyroidism of renal origin: Secondary | ICD-10-CM

## 2015-10-01 DIAGNOSIS — E213 Hyperparathyroidism, unspecified: Secondary | ICD-10-CM

## 2015-10-01 LAB — PTH, INTACT AND CALCIUM
CALCIUM: 8.8 mg/dL (ref 8.4–10.5)
PTH: 115 pg/mL — AB (ref 14–64)

## 2015-10-01 NOTE — Telephone Encounter (Signed)
Will you please let patient know that his blood work confirms that the condition Dr. Jetta Lout was curious about is due to hyperparathyroidism.  This typically occurs when the kidneys have trouble balancing calcium and phosphorous in the urine and blood.  This is easily managed however I need additional labs to see what the next step in treatment is.  Lab slip in your in box.

## 2015-10-02 ENCOUNTER — Other Ambulatory Visit: Payer: Self-pay

## 2015-10-02 DIAGNOSIS — G40909 Epilepsy, unspecified, not intractable, without status epilepticus: Secondary | ICD-10-CM

## 2015-10-02 MED ORDER — CARBAMAZEPINE 200 MG PO TABS: 200.0000 mg | ORAL_TABLET | Freq: Every day | ORAL | Status: DC

## 2015-10-02 NOTE — Telephone Encounter (Signed)
Pt.notified

## 2015-10-03 DIAGNOSIS — N2581 Secondary hyperparathyroidism of renal origin: Secondary | ICD-10-CM | POA: Diagnosis not present

## 2015-10-04 LAB — PHOSPHORUS: PHOSPHORUS: 2.6 mg/dL (ref 2.1–4.3)

## 2015-10-04 LAB — BASIC METABOLIC PANEL WITH GFR
BUN: 14 mg/dL (ref 7–25)
CO2: 26 mmol/L (ref 20–31)
Calcium: 9.1 mg/dL (ref 8.6–10.3)
Chloride: 100 mmol/L (ref 98–110)
Creat: 1.13 mg/dL (ref 0.70–1.25)
GFR, EST AFRICAN AMERICAN: 78 mL/min (ref >=60)
GFR, Est Non African American: 67 mL/min (ref >=60)
GLUCOSE: 174 mg/dL — AB (ref 65–99)
POTASSIUM: 4 mmol/L (ref 3.5–5.3)
Sodium: 142 mmol/L (ref 135–146)

## 2015-10-04 LAB — VITAMIN D 25 HYDROXY (VIT D DEFICIENCY, FRACTURES): Vit D, 25-Hydroxy: 13 ng/mL — ABNORMAL LOW (ref 30–100)

## 2015-10-23 ENCOUNTER — Ambulatory Visit (INDEPENDENT_AMBULATORY_CARE_PROVIDER_SITE_OTHER): Payer: Medicare Other | Admitting: Family Medicine

## 2015-10-23 ENCOUNTER — Encounter: Payer: Self-pay | Admitting: Family Medicine

## 2015-10-23 VITALS — BP 158/96 | HR 83 | Wt 191.0 lb

## 2015-10-23 DIAGNOSIS — N2581 Secondary hyperparathyroidism of renal origin: Secondary | ICD-10-CM

## 2015-10-23 DIAGNOSIS — K2100 Gastro-esophageal reflux disease with esophagitis, without bleeding: Secondary | ICD-10-CM

## 2015-10-23 DIAGNOSIS — K219 Gastro-esophageal reflux disease without esophagitis: Secondary | ICD-10-CM | POA: Insufficient documentation

## 2015-10-23 DIAGNOSIS — G40909 Epilepsy, unspecified, not intractable, without status epilepticus: Secondary | ICD-10-CM | POA: Diagnosis not present

## 2015-10-23 MED ORDER — CARBAMAZEPINE 200 MG PO TABS: 200.0000 mg | ORAL_TABLET | Freq: Every day | ORAL | Status: DC

## 2015-10-23 NOTE — Progress Notes (Signed)
CC: Robert Hodges is a 65 y.o. male is here for Medication Refill; Gastroesophageal Reflux; and Nutrition Counseling   Subjective: HPI:  Complains of a mild regurgitation sensation in the back of his throat with burning behind the sternum if he lies a 45 angle after eating breakfast. Symptoms are slightly improved with drinking water. No symptoms of his standing upright or sitting upright. He denies any vomiting or nausea. He denies any abdominal discomfort. No interventions as of yet.  Since I saw him last he has recognized that he doesn't have much room for improvement with respect to reducing phosphorus in his diet. For example he doesn't drink any cola's, doesn't eat any dairy products and eats virtually no processed foods. He denies any motor or sensory disturbances other than his complaint above.  It's been a year since his Tegretol level was checked last. He denies any new epileptic activity or cognitive changes since I saw him last.   Review Of Systems Outlined In HPI  Past Medical History  Diagnosis Date  . Seizure disorder [facility])     Past Surgical History  Procedure Laterality Date  . Pudendal nerve  pudendal nerve     surgery   No family history on file.  Social History   Social History  . Marital Status: Married    Spouse Name: N/A  . Number of Children: N/A  . Years of Education: N/A   Occupational History  . Not on file.   Social History Main Topics  . Smoking status: Former Research scientist (life sciences)  . Smokeless tobacco: Never Used  . Alcohol Use: Not on file  . Drug Use: No  . Sexual Activity:    Partners: Female   Other Topics Concern  . Not on file   Social History Narrative     Objective: BP 158/96 mmHg  Pulse 83  Wt 191 lb (86.637 kg)  Vital signs reviewed. General: Alert and Oriented, No Acute Distress HEENT: Pupils equal, round, reactive to light. Conjunctivae clear.  External ears unremarkable.  Moist mucous membranes. Lungs: Clear and comfortable work  of breathing, speaking in full sentences without accessory muscle use. Cardiac: Regular rate and rhythm.  Neuro: CN II-XII grossly intact, gait normal. Extremities: No peripheral edema.  Strong peripheral pulses.  Mental Status: No depression, anxiety, nor agitation. Logical though process. Skin: Warm and dry.  Assessment & Plan: [name] was seen today for medication refill, gastroesophageal reflux and nutrition counseling.  Diagnoses and all orders for this visit:  Gastroesophageal reflux disease with esophagitis  Secondary hyperparathyroidism ([place]) -     PTH, Intact and Calcium -     Phosphorus  Nonintractable epilepsy without status epilepticus, unspecified epilepsy type ([place]) -     Carbamazepine Level (Tegretol), total   GERD: Will be starting calcium carbonate to be use as a phosphate binder to help control his secondary hyperparathyroidism thank you this will also help with his GERD. I've asked him to submit labs after 2-3 months of 3 times a day calcium carbonate / Tums consumption shooting for 1 tablet with each meal. Epilepsy: Currently controlled due for Tegretol level, and eats okay to push this off until he is getting lab work done for hyperparathyroidism   25 minutes spent face-to-face during visit today of which at least 50% was counseling or coordinating care regarding: 1. Gastroesophageal reflux disease with esophagitis   2. Secondary hyperparathyroidism ([name])   3. Nonintractable epilepsy without status epilepticus, unspecified epilepsy type ([name])      No  Follow-up on file.

## 2016-01-21 DIAGNOSIS — G40909 Epilepsy, unspecified, not intractable, without status epilepticus: Secondary | ICD-10-CM | POA: Diagnosis not present

## 2016-01-21 DIAGNOSIS — N2581 Secondary hyperparathyroidism of renal origin: Secondary | ICD-10-CM | POA: Diagnosis not present

## 2016-01-22 LAB — PHOSPHORUS: Phosphorus: 2.6 mg/dL (ref 2.1–4.3)

## 2016-01-22 LAB — PTH, INTACT AND CALCIUM
Calcium: 9.2 mg/dL (ref 8.4–10.5)
PTH: 71 pg/mL — ABNORMAL HIGH (ref 14–64)

## 2016-01-22 LAB — CARBAMAZEPINE LEVEL, TOTAL: Carbamazepine Lvl: 5 mg/L (ref 4.0–12.0)

## 2016-01-28 ENCOUNTER — Encounter: Payer: Self-pay | Admitting: Family Medicine

## 2016-01-28 ENCOUNTER — Ambulatory Visit (INDEPENDENT_AMBULATORY_CARE_PROVIDER_SITE_OTHER): Payer: Medicare Other | Admitting: Family Medicine

## 2016-01-28 ENCOUNTER — Telehealth: Payer: Self-pay | Admitting: Family Medicine

## 2016-01-28 VITALS — BP 137/84 | HR 65 | Wt 193.0 lb

## 2016-01-28 DIAGNOSIS — G40909 Epilepsy, unspecified, not intractable, without status epilepticus: Secondary | ICD-10-CM | POA: Diagnosis not present

## 2016-01-28 DIAGNOSIS — R0683 Snoring: Secondary | ICD-10-CM

## 2016-01-28 DIAGNOSIS — R001 Bradycardia, unspecified: Secondary | ICD-10-CM

## 2016-01-28 DIAGNOSIS — N2581 Secondary hyperparathyroidism of renal origin: Secondary | ICD-10-CM | POA: Diagnosis not present

## 2016-01-28 DIAGNOSIS — K2100 Gastro-esophageal reflux disease with esophagitis, without bleeding: Secondary | ICD-10-CM

## 2016-01-28 MED ORDER — CARBAMAZEPINE 200 MG PO TABS: 200.0000 mg | ORAL_TABLET | Freq: Every day | ORAL | 3 refills | Status: DC

## 2016-01-28 NOTE — Telephone Encounter (Signed)
Will you please let Zia know that it was indeed a sleep study that Dr. Jetta Lout was eluding to. I can help arrange this to be done at home, someone should be calling him later this week about scheduling a pick up time here at the [place].

## 2016-01-28 NOTE — Telephone Encounter (Signed)
Can you please call Dr. Kateri Plummer office, [phone], and ask them if she can clarify something about [place].  He thinks she wants him to go see a cardiologist, I'd like to do some preliminary testing and would like to know why she's recommended this referral.

## 2016-01-28 NOTE — Telephone Encounter (Signed)
Pt.notified

## 2016-01-28 NOTE — Telephone Encounter (Signed)
Dr. Kateri Plummer stated that after surgery with anesthesia pt heart rate decreased.  She believes that this is an abnormal occurrence with Robert Hodges after anesthesia. She also said that pt should probably get a sleep study as well due to these Sx.  She also offered to write you a letter if needed.

## 2016-01-28 NOTE — Progress Notes (Signed)
CC: Robert Hodges is a 65 y.o. male is here for lab results f/u   Subjective: HPI:  Follow-up hyperparathyroidism: He is taking calcium supplements most days out of the week. He is also decided to take some Boron and Silica supplements in hopes that it'll help reduce his parathyroid hormone levels. He denies any new motor or sensory disturbances. He denies any abdominal pain.  Follow-up epilepsy: He's not had any epileptic events since I first met him a few years ago. He had his Tegretol level checked last week and it was therapeutic.  Follow GERD: He tells me that when he takes his calcium carbonate as prescribed he has no reflux symptoms whatsoever.  He was recently told by his orofacial surgeon that he should see a cardiologist due to difficulty with arousing him after anesthesia and for bradycardia while receiving anesthesia.   Review Of Systems Outlined In HPI  Past Medical History:  Diagnosis Date  . Seizure disorder [facility])     Past Surgical History:  Procedure Laterality Date  . pudendal nerve  pudendal nerve    surgery   No family history on file.  Social History   Social History  . Marital status: Married    Spouse name: N/A  . Number of children: N/A  . Years of education: N/A   Occupational History  . Not on file.   Social History Main Topics  . Smoking status: Former Research scientist (life sciences)  . Smokeless tobacco: Never Used  . Alcohol use Not on file  . Drug use: No  . Sexual activity: Not Currently    Partners: Female   Other Topics Concern  . Not on file   Social History Narrative  . No narrative on file     Objective: BP 137/84   Pulse 65   Wt 193 lb (87.5 kg)   BMI 26.54 kg/m   General: Alert and Oriented, No Acute Distress HEENT: Pupils equal, round, reactive to light. Conjunctivae clear. Moist mucous membranes Lungs: Clear to auscultation bilaterally, no wheezing/ronchi/rales.  Comfortable work of breathing. Good air movement. Cardiac: Regular rate and  rhythm. Normal S1/S2.  No murmurs, rubs, nor gallops.   Extremities: No peripheral edema.  Strong peripheral pulses.  Mental Status: No depression, anxiety, nor agitation. Skin: Warm and dry.  Assessment & Plan: Robert Hodges was seen today for lab results f/u.  Diagnoses and all orders for this visit:  Secondary hyperparathyroidism ([place])  Nonintractable epilepsy without status epilepticus, unspecified epilepsy type ([place]) -     carbamazepine (TEGRETOL) 200 MG tablet; Take 1 tablet (200 mg total) by mouth daily. Submit lab work by August 2018  Gastroesophageal reflux disease with esophagitis  Bradycardia   Hyperparathyroidism: Improving I am optimistic that if he takes calcium carbonate 3 times a day as originally prescribed his PTH would be in the normal range. I told him that I'm not sure if boron or silica is going to influence his her thyroid hormone levels. Epilepsy: Currently controlled continue Tegretol GERD: Controlled with calcium carbonate Bradycardia: He had a reassuring EKG last year, I believe he probably needs a sleep study which is why his orofacial surgeon is concerned about him coming out of anesthesia, underwent a reach out to hurt to see if she can clarify what symptoms or signs are prompting her recommendation.  Return in about 1 year (around 01/27/2017).  Discussed with this patient that I will be resigning from my position here with [facility] in September in order to stay with my  family who will be moving to [facility]. I let him know about the providers that are still accepting patients and I feel that this individual will be under great care if he/she stays here with [facility].

## 2016-02-04 ENCOUNTER — Telehealth: Payer: Self-pay | Admitting: Family Medicine

## 2016-02-04 DIAGNOSIS — R001 Bradycardia, unspecified: Secondary | ICD-10-CM

## 2016-02-04 DIAGNOSIS — R0683 Snoring: Secondary | ICD-10-CM

## 2016-02-04 NOTE — Telephone Encounter (Signed)
Patient notified; he asked if the insurance will pay for it if he goes to [facility]; I told him they should since this would be a sleep lab that Dr. Ileene Rubens is referring him to per the insurance

## 2016-02-04 NOTE — Telephone Encounter (Signed)
Will you please let patient know that his insurance will not cover a home sleep test and they have requested to have this done in a sleep lab so an order has been placed to [facility].

## 2016-03-04 ENCOUNTER — Ambulatory Visit (HOSPITAL_BASED_OUTPATIENT_CLINIC_OR_DEPARTMENT_OTHER): Payer: Medicare Other | Attending: Family Medicine | Admitting: Internal Medicine

## 2016-03-04 VITALS — Ht 71.0 in | Wt 190.0 lb

## 2016-03-04 DIAGNOSIS — R001 Bradycardia, unspecified: Secondary | ICD-10-CM | POA: Diagnosis not present

## 2016-03-04 DIAGNOSIS — R0683 Snoring: Secondary | ICD-10-CM | POA: Insufficient documentation

## 2016-03-14 NOTE — Procedures (Signed)
  Patient Name: Robert Hodges, Robert Hodges Date: 03/04/2016 Gender: Male D.O.B: 24-Apr-1951 Age (years): 56 Referring Provider: Marcial Pacas Height (inches): 71 Interpreting Physician: Baird Lyons MD, ABSM Weight (lbs): 190 RPSGT: Carolin Coy BMI: 26 MRN: [id number] Neck Size: 15.50 CLINICAL INFORMATION Sleep Study Type: NPSG Indication for sleep study: Snoring Epworth Sleepiness Score: 2 SLEEP STUDY TECHNIQUE As per the AASM Manual for the Scoring of Sleep and Associated Events v2.3 (April 2016) with a hypopnea requiring 4% desaturations. The channels recorded and monitored were frontal, central and occipital EEG, electrooculogram (EOG), submentalis EMG (chin), nasal and oral airflow, thoracic and abdominal wall motion, anterior tibialis EMG, snore microphone, electrocardiogram, and pulse oximetry.  MEDICATIONS Patient's medications include: charted for review. Medications self-administered by patient during sleep study : No sleep medicine administered.  SLEEP ARCHITECTURE The study was initiated at 10:45:13 PM and ended at 4:56:18 AM. Sleep onset time was 43.3 minutes and the sleep efficiency was 70.3%. The total sleep time was 261.0 minutes. Stage REM latency was 198.0 minutes. The patient spent 27.01% of the night in stage N1 sleep, 62.07% in stage N2 sleep, 0.77% in stage N3 and 10.15% in REM. Alpha intrusion was absent. Supine sleep was 5.56%. Wake after sleep onset 66 minutes  RESPIRATORY PARAMETERS The overall apnea/hypopnea index (AHI) was 3.9 per hour. There were 7 total apneas, including 6 obstructive, 1 central and 0 mixed apneas. There were 10 hypopneas and 30 RERAs. The AHI during Stage REM sleep was 2.3 per hour. AHI while supine was 66.2 per hour. The mean oxygen saturation was 94.21%. The minimum SpO2 during sleep was 91.00%. Moderate snoring was noted during this study.  CARDIAC DATA The 2 lead EKG demonstrated sinus rhythm. The mean heart rate was 66.40  beats per minute. Other EKG findings include: None.  LEG MOVEMENT DATA The total PLMS were 96 with a resulting PLMS index of 22.07. Associated arousal with leg movement index was 3.9 .  IMPRESSIONS - No significant obstructive sleep apnea occurred during this study (AHI = 3.9/h). - No significant central sleep apnea occurred during this study (CAI = 0.2/h). - The patient had minimal or no oxygen desaturation during the study (Min O2 = 91.00%) - The patient snored with Moderate snoring volume. - No cardiac abnormalities were noted during this study. - Mild periodic limb movements of sleep occurred during the study. No significant associated arousals.  DIAGNOSIS - Normal study  RECOMMENDATIONS - Positional therapy avoiding supine position during sleep. - Avoid alcohol, sedatives and other CNS depressants that may worsen sleep apnea and disrupt normal sleep architecture. - Sleep hygiene should be reviewed to assess factors that may improve sleep quality. - Weight management and regular exercise should be initiated or continued if appropriate.  [Electronically signed] 03/14/2016 09:13 AM  Baird Lyons MD, ABSM Diplomate, American Board of Sleep Medicine   NPI: [id number]  [place], American Board of Sleep Medicine  ELECTRONICALLY SIGNED ON:  03/14/2016, 9:10 AM [place] PH: [phone]   FX: [phone] [place]

## 2016-08-26 ENCOUNTER — Other Ambulatory Visit: Payer: Self-pay | Admitting: Osteopathic Medicine

## 2016-08-26 DIAGNOSIS — E349 Endocrine disorder, unspecified: Secondary | ICD-10-CM

## 2016-08-26 NOTE — Progress Notes (Signed)
Pt to establish with Sheppard Coil, DO.

## 2016-08-28 ENCOUNTER — Telehealth: Payer: Self-pay

## 2016-08-28 NOTE — Telephone Encounter (Signed)
PTH lab not completed.  Lab said the specimen was not received frozen.

## 2016-08-31 ENCOUNTER — Ambulatory Visit (INDEPENDENT_AMBULATORY_CARE_PROVIDER_SITE_OTHER): Payer: Medicare Other | Admitting: Osteopathic Medicine

## 2016-08-31 DIAGNOSIS — Z5329 Procedure and treatment not carried out because of patient's decision for other reasons: Secondary | ICD-10-CM

## 2016-08-31 LAB — PTH, INTACT AND CALCIUM
Calcium: 10.2 mg/dL (ref 8.6–10.3)
PTH: 48 pg/mL (ref 14–64)

## 2016-09-01 NOTE — Progress Notes (Signed)
NO SHOW

## 2016-09-02 ENCOUNTER — Encounter: Payer: Self-pay | Admitting: Osteopathic Medicine

## 2016-09-02 ENCOUNTER — Ambulatory Visit (INDEPENDENT_AMBULATORY_CARE_PROVIDER_SITE_OTHER): Payer: Medicare Other | Admitting: Osteopathic Medicine

## 2016-09-02 VITALS — BP 126/93 | HR 79 | Ht 71.0 in | Wt 197.0 lb

## 2016-09-02 DIAGNOSIS — K2100 Gastro-esophageal reflux disease with esophagitis, without bleeding: Secondary | ICD-10-CM

## 2016-09-02 DIAGNOSIS — G588 Other specified mononeuropathies: Secondary | ICD-10-CM | POA: Diagnosis not present

## 2016-09-02 DIAGNOSIS — R7301 Impaired fasting glucose: Secondary | ICD-10-CM | POA: Diagnosis not present

## 2016-09-02 DIAGNOSIS — G40909 Epilepsy, unspecified, not intractable, without status epilepticus: Secondary | ICD-10-CM | POA: Diagnosis not present

## 2016-09-02 DIAGNOSIS — N2581 Secondary hyperparathyroidism of renal origin: Secondary | ICD-10-CM

## 2016-09-02 LAB — POCT GLYCOSYLATED HEMOGLOBIN (HGB A1C): HEMOGLOBIN A1C: 6.1

## 2016-09-02 NOTE — Patient Instructions (Addendum)
Plan:   Check Carbamazepine and Calcium levels and other routine labs at least every year  Carbamazepine can also cause eye problems: encourage yearly eye checkups  Plan to come see me in 01/2017 for routine checkup/annual physical

## 2016-09-02 NOTE — Progress Notes (Signed)
HPI: Robert Hodges is a 66 y.o. male  who presents to [facility] today, 09/02/16,  for chief complaint of:  Chief Complaint  Patient presents with  . Other    SWITCH FROM HOMMEL    History of seizure disorder: On carbamazepine and well-controlled, no seizures for the past 15 years. Recent carbamazepine levels within normal range  History of secondary hyperparathyroidism: Recent PTH and calcium check was normal.  History of impaired glucose tolerance: Glucose 170s when last checked, A1c several years ago was 6.0. A1c today still in prediabetic range. Patient declines medication management at this time.  GERD: Stable, takes medications as needed  Pudendal neuralgia: Stable, patient often in her comment position to alleviate pelvic pain, this probably is exacerbating his GERD in his opinion    Past medical, surgical, social and family history reviewed: Patient Active Problem List   Diagnosis Date Noted  . Bradycardia 01/28/2016  . GERD (gastroesophageal reflux disease) 10/23/2015  . Secondary hyperparathyroidism ([place]) 10/01/2015  . External resorption of root of tooth 09/25/2015  . PELVIC PAIN, CHRONIC 06/24/2009  . DYSLIPIDEMIA 06/20/2008  . IMPAIRED FASTING GLUCOSE 03/16/2007  . Epilepsy ([place]) 01/18/2007   Past Surgical History:  Procedure Laterality Date  . pudendal nerve  pudendal nerve    surgery   Social History  Substance Use Topics  . Smoking status: Former Research scientist (life sciences)  . Smokeless tobacco: Never Used  . Alcohol use Not on file   No family history on file.   Current medication list and allergy/intolerance information reviewed:   Current Outpatient Prescriptions  Medication Sig Dispense Refill  . carbamazepine (TEGRETOL) 200 MG tablet Take 1 tablet (200 mg total) by mouth daily. Submit lab work by August 2018 90 tablet 3   No current facility-administered medications for this visit.    No Known Allergies    Review of  Systems:  Constitutional:  No  fever, no chills, No recent illness, No unintentional weight changes. No significant fatigue.   HEENT: No  headache, no vision change  Cardiac: No  chest pain, No  pressure, No palpitations  Respiratory:  No  shortness of breath. No  Cough  Gastrointestinal: No  abdominal pain, No  nausea, No  vomiting,  Musculoskeletal: No new myalgia/arthralgia  Endocrine: No polyuria/polydipsia/polyphagia   Neurologic: No  weakness, No  dizziness  Psychiatric: No  concerns with depression, No  concerns with anxiety  Exam:  BP (!) 126/93   Pulse 79   Ht 5\' 11"  (1.803 m)   Wt 197 lb (89.4 kg)   BMI 27.48 kg/m   Constitutional: VS see above. General Appearance: alert, well-developed, well-nourished, NAD  Eyes: Normal lids and conjunctive, non-icteric sclera  Ears, Nose, Mouth, Throat: MMM, Normal external inspection ears/nares/mouth/lips/gums.  Neck: No masses, trachea midline.  Respiratory: Normal respiratory effort. no wheeze, no rhonchi, no rales  Cardiovascular: S1/S2 normal, no murmur, no rub/gallop auscultated. RRR. No lower extremity edema.   Musculoskeletal: Gait normal. No clubbing/cyanosis of digits.   Neurological: Normal balance/coordination. No tremor. No cranial nerve deficit on limited exam  Psychiatric: Normal judgment/insight. Normal mood and affect. Oriented x3.    Results for orders placed or performed in visit on 09/02/16 (from the past 72 hour(s))  POCT HgB A1C     Status: None   Collection Time: 09/02/16  3:49 PM  Result Value Ref Range   Hemoglobin A1C 6.1      ASSESSMENT/PLAN:   Orders placed for future labs prior to  next visit. Patient declines labs at this time. Has enough medication to last until August, encouraged to follow-up with me around then, sooner if needed  Nonintractable epilepsy without status epilepticus, unspecified epilepsy type ([place]) - Plan: Carbamazepine, Free and Total  Impaired fasting glucose -  Plan: CBC with Differential/Platelet, COMPLETE METABOLIC PANEL WITH GFR, Lipid panel, TSH, Hemoglobin A1c, POCT HgB A1C  Secondary hyperparathyroidism ([place]) - Plan: VITAMIN D 25 Hydroxy (Vit-D Deficiency, Fractures), Phosphorus, PTH, Intact and Calcium  Gastroesophageal reflux disease with esophagitis  Pudendal neuralgia    Patient Instructions  Plan:   Check Carbamazepine and Calcium levels and other routine labs at least every year  Carbamazepine can also cause eye problems: encourage yearly eye checkups  Plan to come see me in 01/2017 for routine checkup/annual physical     Visit summary with medication list and pertinent instructions was printed for patient to review. All questions at time of visit were answered - patient instructed to contact office with any additional concerns. ER/RTC precautions were reviewed with the patient. Follow-up plan: Return in about 6 months (around 03/05/2017) for [place], sooner if needed .

## 2016-09-30 DIAGNOSIS — Z86010 Personal history of colonic polyps: Secondary | ICD-10-CM | POA: Diagnosis not present

## 2016-09-30 DIAGNOSIS — R1310 Dysphagia, unspecified: Secondary | ICD-10-CM | POA: Diagnosis not present

## 2016-11-02 ENCOUNTER — Encounter: Payer: Self-pay | Admitting: Osteopathic Medicine

## 2016-11-02 DIAGNOSIS — R1310 Dysphagia, unspecified: Secondary | ICD-10-CM | POA: Insufficient documentation

## 2016-11-02 DIAGNOSIS — K635 Polyp of colon: Secondary | ICD-10-CM | POA: Insufficient documentation

## 2016-11-05 DIAGNOSIS — K210 Gastro-esophageal reflux disease with esophagitis: Secondary | ICD-10-CM | POA: Diagnosis not present

## 2016-11-05 DIAGNOSIS — R1310 Dysphagia, unspecified: Secondary | ICD-10-CM | POA: Diagnosis not present

## 2016-11-05 DIAGNOSIS — Z86010 Personal history of colonic polyps: Secondary | ICD-10-CM | POA: Diagnosis not present

## 2016-11-05 LAB — HM COLONOSCOPY

## 2016-11-13 ENCOUNTER — Encounter: Payer: Self-pay | Admitting: Osteopathic Medicine

## 2017-02-05 DIAGNOSIS — G40909 Epilepsy, unspecified, not intractable, without status epilepticus: Secondary | ICD-10-CM | POA: Diagnosis not present

## 2017-02-05 DIAGNOSIS — R7301 Impaired fasting glucose: Secondary | ICD-10-CM | POA: Diagnosis not present

## 2017-02-05 DIAGNOSIS — N2581 Secondary hyperparathyroidism of renal origin: Secondary | ICD-10-CM | POA: Diagnosis not present

## 2017-02-05 LAB — CBC WITH DIFFERENTIAL/PLATELET
BASOS PCT: 0 %
Basophils Absolute: 0 cells/uL (ref 0–200)
EOS PCT: 3 %
Eosinophils Absolute: 162 cells/uL (ref 15–500)
HEMATOCRIT: 43 % (ref 38.5–50.0)
Hemoglobin: 14.5 g/dL (ref 13.2–17.1)
LYMPHS ABS: 1728 {cells}/uL (ref 850–3900)
LYMPHS PCT: 32 %
MCH: 30.9 pg (ref 27.0–33.0)
MCHC: 33.7 g/dL (ref 32.0–36.0)
MCV: 91.5 fL (ref 80.0–100.0)
MONO ABS: 324 {cells}/uL (ref 200–950)
MPV: 9.6 fL (ref 7.5–12.5)
Monocytes Relative: 6 %
Neutro Abs: 3186 cells/uL (ref 1500–7800)
Neutrophils Relative %: 59 %
Platelets: 241 10*3/uL (ref 140–400)
RBC: 4.7 MIL/uL (ref 4.20–5.80)
RDW: 13 % (ref 11.0–15.0)
WBC: 5.4 10*3/uL (ref 3.8–10.8)

## 2017-02-06 LAB — HEMOGLOBIN A1C
Hgb A1c MFr Bld: 6 % — ABNORMAL HIGH
Mean Plasma Glucose: 126 mg/dL

## 2017-02-06 LAB — COMPLETE METABOLIC PANEL WITH GFR
ALT: 31 U/L (ref 9–46)
AST: 19 U/L (ref 10–35)
Albumin: 4.5 g/dL (ref 3.6–5.1)
Alkaline Phosphatase: 71 U/L (ref 40–115)
BUN: 15 mg/dL (ref 7–25)
CALCIUM: 9.1 mg/dL (ref 8.6–10.3)
CHLORIDE: 105 mmol/L (ref 98–110)
CO2: 27 mmol/L (ref 20–32)
CREATININE: 1 mg/dL (ref 0.70–1.25)
GFR, Est African American: 89 mL/min (ref >=60)
GFR, Est Non African American: 77 mL/min (ref >=60)
Glucose, Bld: 148 mg/dL — ABNORMAL HIGH (ref 65–99)
Potassium: 4.4 mmol/L (ref 3.5–5.3)
SODIUM: 141 mmol/L (ref 135–146)
Total Bilirubin: 0.3 mg/dL (ref 0.2–1.2)
Total Protein: 6.8 g/dL (ref 6.1–8.1)

## 2017-02-06 LAB — LIPID PANEL
CHOL/HDL RATIO: 5.3 ratio — AB (ref <5.0)
CHOLESTEROL: 239 mg/dL — AB (ref <200)
HDL: 45 mg/dL (ref >40)
LDL Cholesterol: 151 mg/dL — ABNORMAL HIGH (ref <100)
Triglycerides: 213 mg/dL — ABNORMAL HIGH (ref <150)
VLDL: 43 mg/dL — AB (ref <30)

## 2017-02-06 LAB — PHOSPHORUS: Phosphorus: 2.7 mg/dL (ref 2.1–4.3)

## 2017-02-06 LAB — VITAMIN D 25 HYDROXY (VIT D DEFICIENCY, FRACTURES): VIT D 25 HYDROXY: 81 ng/mL (ref 30–100)

## 2017-02-06 LAB — TSH: TSH: 2.21 m[IU]/L (ref 0.40–4.50)

## 2017-02-08 LAB — PTH, INTACT AND CALCIUM
Calcium: 9.1 mg/dL (ref 8.6–10.3)
PTH: 62 pg/mL (ref 14–64)

## 2017-02-11 ENCOUNTER — Encounter: Payer: Self-pay | Admitting: Osteopathic Medicine

## 2017-02-11 ENCOUNTER — Ambulatory Visit (INDEPENDENT_AMBULATORY_CARE_PROVIDER_SITE_OTHER): Payer: Medicare Other | Admitting: Osteopathic Medicine

## 2017-02-11 VITALS — BP 137/88 | HR 67 | Ht 71.0 in | Wt 198.0 lb

## 2017-02-11 DIAGNOSIS — K2100 Gastro-esophageal reflux disease with esophagitis, without bleeding: Secondary | ICD-10-CM

## 2017-02-11 DIAGNOSIS — G40909 Epilepsy, unspecified, not intractable, without status epilepticus: Secondary | ICD-10-CM

## 2017-02-11 DIAGNOSIS — R7301 Impaired fasting glucose: Secondary | ICD-10-CM | POA: Diagnosis not present

## 2017-02-11 MED ORDER — CARBAMAZEPINE 200 MG PO TABS: 200.0000 mg | ORAL_TABLET | Freq: Every day | ORAL | 3 refills | Status: DC

## 2017-02-11 MED ORDER — PANTOPRAZOLE SODIUM 40 MG PO TBEC: 40.0000 mg | DELAYED_RELEASE_TABLET | Freq: Every day | ORAL | 3 refills | Status: DC

## 2017-02-11 NOTE — Progress Notes (Signed)
HPI: Robert Hodges is a 66 y.o. male  who presents to [facility] today, 02/11/17,  for chief complaint of:  Chief Complaint  Patient presents with  . Follow-up    Medication refills    History of seizure disorder: On carbamazepine and well-controlled, no seizures for the past 15 years. Recent carbamazepine levels drawn and still pending  History of impaired glucose tolerance: A1c today still in prediabetic range. Patient declines medication management at this time.  GERD: GI put him on Protonix 40 mg bid and he has some questions about whether to stay on this medicine.   Pudendal neuralgia: Stable, patient often in recumbent position to alleviate pelvic pain, this probably is exacerbating his GERD in his opinion    Past medical, surgical, social and family history reviewed: Patient Active Problem List   Diagnosis Date Noted  . Dysphagia 11/02/2016  . Colon polyp 11/02/2016  . Bradycardia 01/28/2016  . GERD (gastroesophageal reflux disease) 10/23/2015  . Secondary hyperparathyroidism ([place]) 10/01/2015  . External resorption of root of tooth 09/25/2015  . Pudendal neuralgia 06/24/2009  . DYSLIPIDEMIA 06/20/2008  . IMPAIRED FASTING GLUCOSE 03/16/2007  . Epilepsy ([place]) 01/18/2007   Past Surgical History:  Procedure Laterality Date  . pudendal nerve  pudendal nerve    surgery   Social History  Substance Use Topics  . Smoking status: Former Research scientist (life sciences)  . Smokeless tobacco: Never Used  . Alcohol use Not on file   No family history on file.   Current medication list and allergy/intolerance information reviewed:   Current Outpatient Prescriptions  Medication Sig Dispense Refill  . carbamazepine (TEGRETOL) 200 MG tablet Take 1 tablet (200 mg total) by mouth daily. Submit lab work by August 2018 90 tablet 3  . pantoprazole (PROTONIX) 40 MG tablet Take 40 mg by mouth 2 (two) times daily.     No current facility-administered medications for  this visit.    No Known Allergies    Review of Systems:  Constitutional:  No  fever, no chills, No recent illness, No unintentional weight changes. No significant fatigue.   HEENT: No  headache, no vision change  Cardiac: No  chest pain, No  pressure, No palpitations  Respiratory:  No  shortness of breath. No  Cough  Gastrointestinal: No  abdominal pain  Musculoskeletal: No new myalgia/arthralgia  Neurologic: No  weakness, No  dizziness   Exam:  BP 137/88   Pulse 67   Ht 5\' 11"  (1.803 m)   Wt 198 lb (89.8 kg)   BMI 27.62 kg/m   Constitutional: VS see above. General Appearance: alert, well-developed, well-nourished, NAD  Eyes: Normal lids and conjunctive, non-icteric sclera  Ears, Nose, Mouth, Throat: MMM, Normal external inspection ears/nares/mouth/lips/gums.  Neck: No masses, trachea midline.  Respiratory: Normal respiratory effort. no wheeze, no rhonchi, no rales  Cardiovascular: S1/S2 normal, no murmur, no rub/gallop auscultated. RRR. No lower extremity edema.   Musculoskeletal: Gait normal. No clubbing/cyanosis of digits.   Neurological: Normal balance/coordination. No tremor. No cranial nerve deficit on limited exam  Psychiatric: Normal judgment/insight. Normal mood and affect. Oriented x3.    Recent labs reviewed in detail with the patient    ASSESSMENT/PLAN:   Nonintractable epilepsy without status epilepticus, unspecified epilepsy type ([place]) - Plan: carbamazepine (TEGRETOL) 200 MG tablet  Gastroesophageal reflux disease with esophagitis - advised can go to 40 mg Protonix, discussed possible long-term PPI effects but he states his QOL is much better ont he meds so would like  to conitnue  Impaired fasting glucose - Diet/exercise and monitor every 4-6 months     Visit summary with medication list and pertinent instructions was printed for patient to review. All questions at time of visit were answered - patient instructed to contact office with any  additional concerns. ER/RTC precautions were reviewed with the patient. Follow-up plan: Return in about 6 months (around 08/12/2017) for [place].    Total time spent 25 minutes, greater than 50% of the visit was face to face counseling and coordinating care for diagnosis of The primary encounter diagnosis was Nonintractable epilepsy without status epilepticus, unspecified epilepsy type ([place]). Diagnoses of Gastroesophageal reflux disease with esophagitis and Impaired fasting glucose were also pertinent to this visit. Marland Kitchen

## 2017-02-17 ENCOUNTER — Other Ambulatory Visit: Payer: Self-pay | Admitting: Osteopathic Medicine

## 2017-02-17 DIAGNOSIS — G40909 Epilepsy, unspecified, not intractable, without status epilepticus: Secondary | ICD-10-CM

## 2017-02-17 LAB — CARBAMAZEPINE, FREE AND TOTAL
CARBAMAZEPINE METABOLITE/: 0.3 ug/mL
CARBAMAZEPINE METABOLITE: 0.2 ug/mL (ref 0.1–1.0)
CARBAMAZEPINE, BOUND: 3.5 ug/mL
Carbamazepine Metabolite -: 0.5 ug/mL (ref 0.2–2.0)
Carbamazepine, Free: 1 ug/mL (ref 1.0–3.0)
Carbamazepine, Total: 4.5 ug/mL (ref 4.0–12.0)

## 2017-02-17 NOTE — Progress Notes (Signed)
Will need repeat tegretol levels to make sur enot getting too low

## 2017-03-05 ENCOUNTER — Encounter: Payer: Medicare Other | Admitting: Osteopathic Medicine

## 2017-06-23 DIAGNOSIS — Z01812 Encounter for preprocedural laboratory examination: Secondary | ICD-10-CM | POA: Diagnosis not present

## 2017-06-23 DIAGNOSIS — R791 Abnormal coagulation profile: Secondary | ICD-10-CM | POA: Diagnosis not present

## 2017-06-23 DIAGNOSIS — Z01818 Encounter for other preprocedural examination: Secondary | ICD-10-CM | POA: Diagnosis not present

## 2017-08-12 ENCOUNTER — Encounter: Payer: Self-pay | Admitting: Osteopathic Medicine

## 2017-08-12 ENCOUNTER — Ambulatory Visit (INDEPENDENT_AMBULATORY_CARE_PROVIDER_SITE_OTHER): Payer: Medicare Other | Admitting: Osteopathic Medicine

## 2017-08-12 VITALS — BP 130/93 | HR 68 | Temp 97.7°F | Wt 201.0 lb

## 2017-08-12 DIAGNOSIS — G40909 Epilepsy, unspecified, not intractable, without status epilepticus: Secondary | ICD-10-CM

## 2017-08-12 DIAGNOSIS — K2100 Gastro-esophageal reflux disease with esophagitis, without bleeding: Secondary | ICD-10-CM

## 2017-08-12 DIAGNOSIS — R7301 Impaired fasting glucose: Secondary | ICD-10-CM

## 2017-08-12 DIAGNOSIS — G588 Other specified mononeuropathies: Secondary | ICD-10-CM

## 2017-08-12 LAB — POCT GLYCOSYLATED HEMOGLOBIN (HGB A1C): Hemoglobin A1C: 6

## 2017-08-12 MED ORDER — PANTOPRAZOLE SODIUM 40 MG PO TBEC: 40.0000 mg | DELAYED_RELEASE_TABLET | Freq: Two times a day (BID) | ORAL | 3 refills | Status: DC

## 2017-08-12 NOTE — Progress Notes (Signed)
HPI: Robert Hodges is a 67 y.o. male  who presents to [facility] today, 08/12/17,  for chief complaint of:  Follow up pre-diabetes  History of impaired glucose tolerance: A1c today still in prediabetic range at 6.0% - Patient declines metformin medication management at this time.  GERD: GI put him on Protonix 40 mg bid - we discussed long term PPI effects, he states QOL is worth these risks, taking 1 AM and 1/2 PM.   Pudendal neuralgia: Stable, patient often in recumbent position to alleviate pelvic pain, this probably is exacerbating his GERD in his opinion. 1 month ago (06/2017) has stem cell treatment for this in [facility] as part of a clinical triel.   History of seizure disorder: On carbamazepine and well-controlled, no seizures for the past 15+ years.     Past medical, surgical, social and family history reviewed: Patient Active Problem List   Diagnosis Date Noted  . Dysphagia 11/02/2016  . Colon polyp 11/02/2016  . Bradycardia 01/28/2016  . GERD (gastroesophageal reflux disease) 10/23/2015  . Secondary hyperparathyroidism ([place]) 10/01/2015  . External resorption of root of tooth 09/25/2015  . Pudendal neuralgia 06/24/2009  . DYSLIPIDEMIA 06/20/2008  . IMPAIRED FASTING GLUCOSE 03/16/2007  . Epilepsy ([place]) 01/18/2007   Past Surgical History:  Procedure Laterality Date  . pudendal nerve  pudendal nerve    surgery   Social History   Tobacco Use  . Smoking status: Former Research scientist (life sciences)  . Smokeless tobacco: Never Used  Substance Use Topics  . Alcohol use: Not on file   No family history on file.   Current medication list and allergy/intolerance information reviewed:   Current Outpatient Medications  Medication Sig Dispense Refill  . carbamazepine (TEGRETOL) 200 MG tablet Take 1 tablet (200 mg total) by mouth daily. 90 tablet 3  . pantoprazole (PROTONIX) 40 MG tablet Take 1 tablet (40 mg total) by mouth daily. 90 tablet 3   No current  facility-administered medications for this visit.    No Known Allergies    Review of Systems:  Constitutional:  No  fever, no chills, No recent illness, No unintentional weight changes. No significant fatigue.   HEENT: No  headache, no vision change  Cardiac: No  chest pain, No  pressure, No palpitations  Respiratory:  No  shortness of breath. No  Cough  Gastrointestinal: No  abdominal pain  Musculoskeletal: No new myalgia/arthralgia  Neurologic: No  weakness, No  dizziness   Exam:  BP (!) 130/93   Pulse 68   Temp 97.7 F (36.5 C) (Oral)   Wt 201 lb (91.2 kg)   BMI 28.03 kg/m   Constitutional: VS see above. General Appearance: alert, well-developed, well-nourished, NAD  Eyes: Normal lids and conjunctive, non-icteric sclera  Ears, Nose, Mouth, Throat: MMM, Normal external inspection ears/nares/mouth/lips/gums.  Neck: No masses, trachea midline.  Respiratory: Normal respiratory effort. no wheeze, no rhonchi, no rales  Cardiovascular: S1/S2 normal, no murmur, no rub/gallop auscultated. RRR. No lower extremity edema.   Musculoskeletal: Gait normal. No clubbing/cyanosis of digits.   Neurological: Normal balance/coordination. No tremor. No cranial nerve deficit on limited exam  Psychiatric: Normal judgment/insight. Normal mood and affect. Oriented x3.    Results for orders placed or performed in visit on 08/12/17 (from the past 24 hour(s))  POCT HgB A1C     Status: None   Collection Time: 08/12/17 11:07 AM  Result Value Ref Range   Hemoglobin A1C 6.0       ASSESSMENT/PLAN:  Chronic conditions stable, A1C looks good! Continue to monitor. Annual in 6 mos.   Impaired fasting glucose - Plan: POCT HgB A1C  Nonintractable epilepsy without status epilepticus, unspecified epilepsy type ([place])  Gastroesophageal reflux disease with esophagitis  Pudendal neuralgia - stem cell stuff sounds interesting, glad it's working!      Visit summary with medication list and  pertinent instructions was printed for patient to review. All questions at time of visit were answered - patient instructed to contact office with any additional concerns. ER/RTC precautions were reviewed with the patient.   Follow-up plan: Return in about 6 months (around 02/09/2018) for [place] - labs prior to visit .   Total time spent 25 minutes, greater than 50% of the visit was face to face counseling and coordinating care for diagnosis of The primary encounter diagnosis was Impaired fasting glucose. Diagnoses of Nonintractable epilepsy without status epilepticus, unspecified epilepsy type ([place]), Gastroesophageal reflux disease with esophagitis, and Pudendal neuralgia were also pertinent to this visit. Marland Kitchen

## 2017-11-17 ENCOUNTER — Other Ambulatory Visit: Payer: Self-pay | Admitting: Osteopathic Medicine

## 2017-12-06 ENCOUNTER — Telehealth: Payer: Self-pay

## 2017-12-06 MED ORDER — PANTOPRAZOLE SODIUM 40 MG PO TBEC
40.0000 mg | DELAYED_RELEASE_TABLET | Freq: Every day | ORAL | 0 refills | Status: DC
Start: 2017-12-06 — End: 2019-01-18

## 2017-12-06 NOTE — Telephone Encounter (Signed)
Order sent If he is requiring frequent treatment beyond the typical 6-8 weeks, he should follow up with GI - may need scope to check for ulcer or other stomach problem

## 2017-12-06 NOTE — Telephone Encounter (Signed)
Pt called requesting a refill for pantoprazole. As per pt, has been out of med for some time now. Rx was written by GI specialist. Pt is starting to have gi issues come back. Requesting if provider can send in a refill into [facility]. Pls advise. Thanks.

## 2017-12-06 NOTE — Telephone Encounter (Signed)
Pt has been updated & is aware of provider's note. Pt will make an appt w/provider if symptoms continue. As per pt, going to GI specialist is costly ($175) each visit.

## 2017-12-14 ENCOUNTER — Other Ambulatory Visit: Payer: Self-pay | Admitting: Osteopathic Medicine

## 2018-02-01 ENCOUNTER — Telehealth: Payer: Self-pay | Admitting: Osteopathic Medicine

## 2018-02-01 DIAGNOSIS — Z0000 Encounter for general adult medical examination without abnormal findings: Secondary | ICD-10-CM

## 2018-02-01 NOTE — Telephone Encounter (Signed)
Patient came in requesting lab orders to complete before his Medicare Wellness appointment next week. I informed him that his PCP is out of town this week, but that I would request that those labs be ordered for him. He is wanting to get the labs completed and returned before his 8/27 appointment. Please advise patient. Thanks!

## 2018-02-02 NOTE — Telephone Encounter (Signed)
Called patient and notified lab orders are in and can go to lab to bloodwork drawn. KG LPN

## 2018-02-02 NOTE — Telephone Encounter (Signed)
Orders are in Should be able to go directly to lab

## 2018-02-03 ENCOUNTER — Other Ambulatory Visit: Payer: Self-pay | Admitting: Osteopathic Medicine

## 2018-02-03 DIAGNOSIS — G40909 Epilepsy, unspecified, not intractable, without status epilepticus: Secondary | ICD-10-CM

## 2018-02-04 DIAGNOSIS — R7301 Impaired fasting glucose: Secondary | ICD-10-CM | POA: Diagnosis not present

## 2018-02-04 DIAGNOSIS — Z0000 Encounter for general adult medical examination without abnormal findings: Secondary | ICD-10-CM | POA: Diagnosis not present

## 2018-02-08 ENCOUNTER — Encounter: Payer: Self-pay | Admitting: Osteopathic Medicine

## 2018-02-08 ENCOUNTER — Ambulatory Visit (INDEPENDENT_AMBULATORY_CARE_PROVIDER_SITE_OTHER): Payer: Medicare Other | Admitting: Osteopathic Medicine

## 2018-02-08 VITALS — BP 124/69 | HR 62 | Temp 98.3°F | Wt 195.2 lb

## 2018-02-08 DIAGNOSIS — Z0000 Encounter for general adult medical examination without abnormal findings: Secondary | ICD-10-CM

## 2018-02-08 DIAGNOSIS — R7301 Impaired fasting glucose: Secondary | ICD-10-CM | POA: Diagnosis not present

## 2018-02-08 DIAGNOSIS — G40909 Epilepsy, unspecified, not intractable, without status epilepticus: Secondary | ICD-10-CM | POA: Diagnosis not present

## 2018-02-08 MED ORDER — RANITIDINE HCL 150 MG PO TABS: 150.0000 mg | ORAL_TABLET | Freq: Two times a day (BID) | ORAL | 1 refills | Status: DC

## 2018-02-08 MED ORDER — CARBAMAZEPINE 200 MG PO TABS: 200.0000 mg | ORAL_TABLET | Freq: Every day | ORAL | 3 refills | Status: DC

## 2018-02-08 MED ORDER — ZOSTER VAC RECOMB ADJUVANTED 50 MCG/0.5ML IM SUSR: 0.5000 mL | Freq: Once | INTRAMUSCULAR | 0 refills | Status: AC

## 2018-02-08 NOTE — Progress Notes (Signed)
HPI: Robert Hodges is a 67 y.o. male  who presents to [place] today, 02/09/18,  for Medicare Annual Wellness Exam  Patient presents for annual physical/Medicare wellness exam. No complaints today. Just needs refills   GERD - concern for chronic PPI, woul dlike to try alternative    Past medical, surgical, social and family history reviewed:  Patient Active Problem List   Diagnosis Date Noted  . Dysphagia 11/02/2016  . Colon polyp 11/02/2016  . Bradycardia 01/28/2016  . GERD (gastroesophageal reflux disease) 10/23/2015  . Secondary hyperparathyroidism ([place]) 10/01/2015  . External resorption of root of tooth 09/25/2015  . Pudendal neuralgia 06/24/2009  . DYSLIPIDEMIA 06/20/2008  . IMPAIRED FASTING GLUCOSE 03/16/2007  . Epilepsy ([place]) 01/18/2007    Past Surgical History:  Procedure Laterality Date  . pudendal nerve  pudendal nerve    surgery    Social History   Socioeconomic History  . Marital status: Married    Spouse name: Not on file  . Number of children: Not on file  . Years of education: Not on file  . Highest education level: Not on file  Occupational History  . Not on file  Social Needs  . Financial resource strain: Not on file  . Food insecurity:    Worry: Not on file    Inability: Not on file  . Transportation needs:    Medical: Not on file    Non-medical: Not on file  Tobacco Use  . Smoking status: Former Research scientist (life sciences)  . Smokeless tobacco: Never Used  Substance and Sexual Activity  . Alcohol use: Not on file  . Drug use: No  . Sexual activity: Not Currently    Partners: Female  Lifestyle  . Physical activity:    Days per week: Not on file    Minutes per session: Not on file  . Stress: Not on file  Relationships  . Social connections:    Talks on phone: Not on file    Gets together: Not on file    Attends religious service: Not on file    Active member of club or organization: Not on file    Attends  meetings of clubs or organizations: Not on file    Relationship status: Not on file  . Intimate partner violence:    Fear of current or ex partner: Not on file    Emotionally abused: Not on file    Physically abused: Not on file    Forced sexual activity: Not on file  Other Topics Concern  . Not on file  Social History Narrative  . Not on file    No family history on file.   Current medication list and allergy/intolerance information reviewed:    Outpatient Encounter Medications as of 02/08/2018  Medication Sig  . carbamazepine (TEGRETOL) 200 MG tablet Take 1 tablet (200 mg total) by mouth daily.  . pantoprazole (PROTONIX) 40 MG tablet Take 1 tablet (40 mg total) by mouth daily.  . [DISCONTINUED] carbamazepine (TEGRETOL) 200 MG tablet TAKE ONE TABLET BY MOUTH EVERY DAY  . ranitidine (ZANTAC) 150 MG tablet Take 1 tablet (150 mg total) by mouth 2 (two) times daily. (can try 300 mg - 2 tablets - in the evenings instead, if desired)  . [EXPIRED] Zoster Vaccine Adjuvanted [facility]) injection Inject 0.5 mLs into the muscle once for 1 dose. (repeat does in 2-6 months)   No facility-administered encounter medications on file as of 02/08/2018.     No Known Allergies  Review of Systems: Review of Systems - Negative except GERD   Medicare Wellness Questionnaire  Are there smokers in your home (other than you)? no  Depression Screen (Note: if answer to either of the following is "Yes", a more complete depression screening is indicated)   Q1: Over the past two weeks, have you felt down, depressed or hopeless? no  Q2: Over the past two weeks, have you felt little interest or pleasure in doing things? no  Have you lost interest or pleasure in daily life? no  Do you often feel hopeless? no  Do you cry easily over simple problems? no  Activities of Daily Living In your present state of health, do you have any difficulty performing the following activities?:  Driving? no Managing  money?  no Feeding yourself? no Getting from bed to chair? no Climbing a flight of stairs? no Preparing food and eating?: no Bathing or showering? no Getting dressed: no Getting to the toilet? no Using the toilet: no Moving around from place to place: no In the past year have you fallen or had a near fall?: no  Hearing Difficulties:  Do you often ask people to speak up or repeat themselves? no Do you experience ringing or noises in your ears? no  Do you have difficulty understanding soft or whispered voices? no  Memory Difficulties:  Do you feel that you have a problem with memory? no  Do you often misplace items? no  Do you feel safe at home?  yes  Sexual Health:   Are you sexually active?  Yes  Do you have more than one partner?  No  Advanced Directives:   Advanced directives discussed: has an advanced directive - a copy HAS NOT been provided.  Additional information provided: yes  Risk Factors  Current exercise habits: walking  Dietary issues discussed:no concerns  Cardiac risk factors: hypertension, hypercholesterolemia/hyperlipidemia   Exam:  BP 124/69 (BP Location: Left Arm, Patient Position: Sitting, Cuff Size: Normal)   Pulse 62   Temp 98.3 F (36.8 C) (Oral)   Wt 195 lb 3.2 oz (88.5 kg)   BMI 27.22 kg/m  Vision by Snellen chart: right eye:see nurse notes, left eye:see nurse notes  Constitutional: VS see above. General Appearance: alert, well-developed, well-nourished, NAD  Ears, Nose, Mouth, Throat: MMM  Neck: No masses, trachea midline.   Respiratory: Normal respiratory effort. no wheeze, no rhonchi, no rales  Cardiovascular:No lower extremity edema.   Musculoskeletal: Gait normal. No clubbing/cyanosis of digits.   Neurological: Normal balance/coordination. No tremor. Recalls 3 objects and able to read face of watch with correct time.   Skin: warm, dry, intact. No rash/ulcer.   Psychiatric: Normal judgment/insight. Normal mood and affect.  Oriented x3.     ASSESSMENT/PLAN:   Encounter for Medicare annual wellness exam  Medicare annual wellness visit, subsequent  Nonintractable epilepsy without status epilepticus, unspecified epilepsy type ([place]) - Plan: carbamazepine (TEGRETOL) 200 MG tablet  Annual physical exam  Impaired fasting glucose   During the course of the visit the patient was educated and counseled about appropriate screening and preventive services as noted above.   Patient Instructions (the written plan) was given to the patient.  Medicare Attestation I have personally reviewed: The patient's medical and social history Their use of alcohol, tobacco or illicit drugs Their current medications and supplements The patient's functional ability including ADLs,fall risks, home safety risks, cognitive, and hearing and visual impairment Diet and physical activities Evidence for depression or mood disorders  The  patient's weight, height, BMI, and visual acuity have been recorded in the chart.  I have made referrals, counseling, and provided education to the patient based on review of the above and I have provided the patient with a written personalized care plan for preventive services.     Emeterio Reeve, DO   02/09/18   Visit summary with medication list and pertinent instructions was printed for patient to review. All questions at time of visit were answered - patient instructed to contact office with any additional concerns. ER/RTC precautions were reviewed with the patient. Follow-up plan: Return in about 1 year (around 02/09/2019) for medicare visit, A1C check with Dr A in the office in 6 months (sooner depending on A1C results now) .

## 2018-02-09 ENCOUNTER — Encounter: Payer: Self-pay | Admitting: Osteopathic Medicine

## 2018-02-09 LAB — CBC
HEMATOCRIT: 43.3 % (ref 38.5–50.0)
Hemoglobin: 14.6 g/dL (ref 13.2–17.1)
MCH: 30.9 pg (ref 27.0–33.0)
MCHC: 33.7 g/dL (ref 32.0–36.0)
MCV: 91.5 fL (ref 80.0–100.0)
MPV: 9.9 fL (ref 7.5–12.5)
PLATELETS: 262 10*3/uL (ref 140–400)
RBC: 4.73 10*6/uL (ref 4.20–5.80)
RDW: 12.5 % (ref 11.0–15.0)
WBC: 6.5 10*3/uL (ref 3.8–10.8)

## 2018-02-09 LAB — LIPID PANEL
CHOL/HDL RATIO: 4.9 (calc) (ref <5.0)
Cholesterol: 235 mg/dL — ABNORMAL HIGH (ref <200)
HDL: 48 mg/dL (ref >40)
LDL CHOLESTEROL (CALC): 157 mg/dL — AB
Non-HDL Cholesterol (Calc): 187 mg/dL (calc) — ABNORMAL HIGH (ref <130)
Triglycerides: 164 mg/dL — ABNORMAL HIGH (ref <150)

## 2018-02-09 LAB — COMPLETE METABOLIC PANEL WITH GFR
AG RATIO: 1.7 (calc) (ref 1.0–2.5)
ALT: 19 U/L (ref 9–46)
AST: 16 U/L (ref 10–35)
Albumin: 4.5 g/dL (ref 3.6–5.1)
Alkaline phosphatase (APISO): 69 U/L (ref 40–115)
BILIRUBIN TOTAL: 0.4 mg/dL (ref 0.2–1.2)
BUN: 14 mg/dL (ref 7–25)
CALCIUM: 9.5 mg/dL (ref 8.6–10.3)
CO2: 26 mmol/L (ref 20–32)
CREATININE: 1.07 mg/dL (ref 0.70–1.25)
Chloride: 105 mmol/L (ref 98–110)
GFR, EST AFRICAN AMERICAN: 82 mL/min/{1.73_m2} (ref >=60)
GFR, EST NON AFRICAN AMERICAN: 70 mL/min/{1.73_m2} (ref >=60)
GLOBULIN: 2.6 g/dL (ref 1.9–3.7)
Glucose, Bld: 112 mg/dL — ABNORMAL HIGH (ref 65–99)
Potassium: 4.6 mmol/L (ref 3.5–5.3)
Sodium: 142 mmol/L (ref 135–146)
Total Protein: 7.1 g/dL (ref 6.1–8.1)

## 2018-02-09 LAB — TEST AUTHORIZATION

## 2018-02-09 LAB — HEMOGLOBIN A1C W/OUT EAG: Hgb A1c MFr Bld: 5.9 % of total Hgb — ABNORMAL HIGH (ref <5.7)

## 2018-02-09 LAB — PSA, TOTAL WITH REFLEX TO PSA, FREE: PSA, Total: 1.4 ng/mL (ref <=4.0)

## 2018-03-21 DIAGNOSIS — L570 Actinic keratosis: Secondary | ICD-10-CM | POA: Diagnosis not present

## 2018-07-28 ENCOUNTER — Other Ambulatory Visit: Payer: Self-pay | Admitting: Osteopathic Medicine

## 2018-07-28 NOTE — Telephone Encounter (Signed)
Requested medication (s) are due for refill today: yes  Requested medication (s) are on the active medication list: yes  Last refill:  02/08/18  Future visit scheduled: no  Notes to clinic:  Sending to Dr Redgie Grayer refill pool   Requested Prescriptions  Pending Prescriptions Disp Refills   ranitidine (ZANTAC) 150 MG tablet [Pharmacy Med Name: RANITIDINE HCL 150 MG TABLET] 180 tablet 1    Sig: Take 1 tablet (150 mg total) by mouth 2 (two) times daily. (can try 300 mg - 2 tablets - in the evenings instead, if desired)     Gastroenterology:  H2 Antagonists Failed - 07/28/2018  8:00 AM      Failed - Valid encounter within last 12 months    Recent Outpatient Visits          5 months ago Medicare annual wellness visit, subsequent   [facility], [place], DO   11 months ago Impaired fasting glucose   [place] [facility], Lanelle Bal, DO   1 year ago Nonintractable epilepsy without status epilepticus, unspecified epilepsy type [facility])    [facility], Lanelle Bal, DO   1 year ago Nonintractable epilepsy without status epilepticus, unspecified epilepsy type [facility])   [place], Lanelle Bal, DO   1 year ago Failure to attend appointment   [facility], [place], DO

## 2018-10-26 ENCOUNTER — Telehealth: Payer: Self-pay

## 2018-10-26 MED ORDER — FAMOTIDINE 20 MG PO TABS: 20.0000 mg | ORAL_TABLET | Freq: Every day | ORAL | 3 refills | Status: DC

## 2018-10-26 NOTE — Telephone Encounter (Signed)
Pt advised.

## 2018-10-26 NOTE — Telephone Encounter (Signed)
Pt called stating that he received notification from ins that ranitidine med has been recalled. Wants to know what other options does he have? Pls advise, thanks.

## 2018-10-26 NOTE — Telephone Encounter (Signed)
I sent an acceptable alternative into the pharmacy, I asked them to cancel the ranitidine but he should make sure that they do it.  Alternative is famotidine AKA Pepcid

## 2018-12-15 ENCOUNTER — Telehealth: Payer: Self-pay | Admitting: Osteopathic Medicine

## 2018-12-15 DIAGNOSIS — Z0000 Encounter for general adult medical examination without abnormal findings: Secondary | ICD-10-CM

## 2018-12-15 DIAGNOSIS — R7301 Impaired fasting glucose: Secondary | ICD-10-CM

## 2018-12-15 NOTE — Telephone Encounter (Signed)
Patient wanted to know if he needed to get labs done first before appointment. He needed a refill on meds. Last OV was 02/08/2018. Please advise.

## 2018-12-15 NOTE — Telephone Encounter (Signed)
Orders are in

## 2018-12-15 NOTE — Telephone Encounter (Signed)
Pt has been updated of lab order. No other inquiries during call.

## 2019-01-10 DIAGNOSIS — Z0000 Encounter for general adult medical examination without abnormal findings: Secondary | ICD-10-CM | POA: Diagnosis not present

## 2019-01-10 DIAGNOSIS — R7301 Impaired fasting glucose: Secondary | ICD-10-CM | POA: Diagnosis not present

## 2019-01-11 LAB — COMPLETE METABOLIC PANEL WITH GFR
AG Ratio: 1.6 (calc) (ref 1.0–2.5)
ALT: 24 U/L (ref 9–46)
AST: 17 U/L (ref 10–35)
Albumin: 4.4 g/dL (ref 3.6–5.1)
Alkaline phosphatase (APISO): 71 U/L (ref 35–144)
BUN: 15 mg/dL (ref 7–25)
CO2: 23 mmol/L (ref 20–32)
Calcium: 9.4 mg/dL (ref 8.6–10.3)
Chloride: 105 mmol/L (ref 98–110)
Creat: 1 mg/dL (ref 0.70–1.18)
GFR, Est African American: 88 mL/min/{1.73_m2} (ref >=60)
GFR, Est Non African American: 76 mL/min/{1.73_m2} (ref >=60)
Globulin: 2.7 g/dL (calc) (ref 1.9–3.7)
Glucose, Bld: 134 mg/dL — ABNORMAL HIGH (ref 65–99)
Potassium: 4.4 mmol/L (ref 3.5–5.3)
Sodium: 139 mmol/L (ref 135–146)
Total Bilirubin: 0.3 mg/dL (ref 0.2–1.2)
Total Protein: 7.1 g/dL (ref 6.1–8.1)

## 2019-01-11 LAB — CBC
HCT: 44.6 % (ref 38.5–50.0)
Hemoglobin: 14.9 g/dL (ref 13.2–17.1)
MCH: 30.9 pg (ref 27.0–33.0)
MCHC: 33.4 g/dL (ref 32.0–36.0)
MCV: 92.5 fL (ref 80.0–100.0)
MPV: 9.7 fL (ref 7.5–12.5)
Platelets: 238 10*3/uL (ref 140–400)
RBC: 4.82 10*6/uL (ref 4.20–5.80)
RDW: 12.1 % (ref 11.0–15.0)
WBC: 6.6 10*3/uL (ref 3.8–10.8)

## 2019-01-11 LAB — HEMOGLOBIN A1C
Hgb A1c MFr Bld: 6.4 % of total Hgb — ABNORMAL HIGH (ref <5.7)
Mean Plasma Glucose: 137 (calc)
eAG (mmol/L): 7.6 (calc)

## 2019-01-11 LAB — LIPID PANEL
Cholesterol: 276 mg/dL — ABNORMAL HIGH (ref <200)
HDL: 41 mg/dL (ref >=40)
LDL Cholesterol (Calc): 196 mg/dL (calc) — ABNORMAL HIGH
Non-HDL Cholesterol (Calc): 235 mg/dL (calc) — ABNORMAL HIGH (ref <130)
Total CHOL/HDL Ratio: 6.7 (calc) — ABNORMAL HIGH (ref <5.0)
Triglycerides: 206 mg/dL — ABNORMAL HIGH (ref <150)

## 2019-01-11 LAB — PSA, TOTAL WITH REFLEX TO PSA, FREE: PSA, Total: 1 ng/mL (ref <=4.0)

## 2019-01-18 ENCOUNTER — Telehealth: Payer: Self-pay | Admitting: Osteopathic Medicine

## 2019-01-18 ENCOUNTER — Ambulatory Visit (INDEPENDENT_AMBULATORY_CARE_PROVIDER_SITE_OTHER): Payer: Medicare Other | Admitting: Osteopathic Medicine

## 2019-01-18 ENCOUNTER — Encounter: Payer: Self-pay | Admitting: Osteopathic Medicine

## 2019-01-18 ENCOUNTER — Other Ambulatory Visit: Payer: Self-pay

## 2019-01-18 VITALS — BP 141/80 | HR 64 | Temp 98.4°F | Wt 195.9 lb

## 2019-01-18 DIAGNOSIS — G40909 Epilepsy, unspecified, not intractable, without status epilepticus: Secondary | ICD-10-CM | POA: Diagnosis not present

## 2019-01-18 DIAGNOSIS — H6982 Other specified disorders of Eustachian tube, left ear: Secondary | ICD-10-CM

## 2019-01-18 DIAGNOSIS — K2100 Gastro-esophageal reflux disease with esophagitis, without bleeding: Secondary | ICD-10-CM

## 2019-01-18 MED ORDER — FAMOTIDINE 20 MG PO TABS: 20.0000 mg | ORAL_TABLET | Freq: Every day | ORAL | 3 refills | Status: DC

## 2019-01-18 MED ORDER — CARBAMAZEPINE 200 MG PO TABS: 200.0000 mg | ORAL_TABLET | Freq: Every day | ORAL | 3 refills | Status: DC

## 2019-01-18 MED ORDER — IPRATROPIUM BROMIDE 0.06 % NA SOLN: 2.0000 | Freq: Four times a day (QID) | NASAL | 1 refills | Status: DC

## 2019-01-18 NOTE — Progress Notes (Signed)
HPI: Robert Hodges is a 68 y.o. male who  has a past medical history of Seizure disorder ([place]).  he presents to [facility] today, 01/18/19,  for chief complaint of:  Ear problem   . Location: L ear, deep  . Quality: aching . Severity: mild . Duration: 2 months . Timing: intermittent  . Modifying factors: nothing tried. Nothing makes better/worse     At today's visit 01/18/19 ... PMH, PSH, FH reviewed and updated as needed.  Current medication list and allergy/intolerance hx reviewed and updated as needed. (See remainder of HPI, ROS, Phys Exam below)   No results found.  No results found for this or any previous visit (from the past 72 hour(s)).        ASSESSMENT/PLAN: The primary encounter diagnosis was Eustachian tube dysfunction, left. Diagnoses of Nonintractable epilepsy without status epilepticus, unspecified epilepsy type ([place]) and Gastroesophageal reflux disease with esophagitis were also pertinent to this visit.      Meds ordered this encounter  Medications  . ipratropium (ATROVENT) 0.06 % nasal spray    Sig: Place 2 sprays into both nostrils 4 (four) times daily.    Dispense:  15 mL    Refill:  1  . carbamazepine (TEGRETOL) 200 MG tablet    Sig: Take 1 tablet (200 mg total) by mouth daily.    Dispense:  90 tablet    Refill:  3    This prescription was filled on 11/15/2017. Any refills authorized will be placed on file.  . famotidine (PEPCID) 20 MG tablet    Sig: Take 1 tablet (20 mg total) by mouth daily.    Dispense:  90 tablet    Refill:  3    Cancel ranitidine thanks    There are no Patient Instructions on file for this visit.    Follow-up plan: No follow-ups on  file.                                                 ################################################# ################################################# ################################################# #################################################    Current Meds  Medication Sig  . carbamazepine (TEGRETOL) 200 MG tablet Take 1 tablet (200 mg total) by mouth daily.  . famotidine (PEPCID) 20 MG tablet Take 1 tablet (20 mg total) by mouth daily.  . [DISCONTINUED] carbamazepine (TEGRETOL) 200 MG tablet Take 1 tablet (200 mg total) by mouth daily.  . [DISCONTINUED] famotidine (PEPCID) 20 MG tablet Take 1 tablet (20 mg total) by mouth daily.    No Known Allergies     Review of Systems:  Constitutional: No recent illness  HEENT: No  headache, no vision change  Cardiac: No  chest pain  Skin: No  Rash  Neurologic: No  weakness, No  Dizziness  Psychiatric: No  concerns with depression, No  concerns with anxiety  Exam:  BP (!) 141/80 (BP Location: Left Arm, Patient Position: Sitting, Cuff Size: Normal)   Pulse 64   Temp 98.4 F (36.9 C) (Oral)   Wt 195 lb 14.4 oz (88.9 kg)   BMI 27.32 kg/m   Constitutional: VS see above. General Appearance: alert, well-developed, well-nourished, NAD  Eyes: Normal lids and conjunctive, non-icteric sclera  Ears, Nose, Mouth, Throat: TM normal bilaterally, mild clear effusion on L   Neck: No masses, trachea midline.   Respiratory: Normal respiratory effort.  Musculoskeletal: Gait normal.  Symmetric and independent movement of all extremities  Neurological: Normal balance/coordination. No tremor.  Skin: warm, dry, intact.   Psychiatric: Normal judgment/insight. Normal mood and affect. Oriented x3.       Visit summary with medication list and pertinent instructions was printed for patient to review, patient was advised to alert [place] if any updates are needed. All questions at time of  visit were answered - patient instructed to contact office with any additional concerns. ER/RTC precautions were reviewed with the patient and understanding verbalized.    Please note: voice recognition software was used to produce this document, and typos may escape review. Please contact Dr. Sheppard Coil for any needed clarifications.    Follow up plan: Return if symptoms worsen or fail to improve.

## 2019-01-18 NOTE — Telephone Encounter (Signed)
Pt c/o of left ear pain x 2 months.  Describes the pain as a "minor earache" and rates the pain as 3/10.  Advised pt to schedule OV for evaluation and possible treatment/referral to ENT.  Pt is agreeable and was given appt for today at 2:40 pm.  Charyl Bigger, CMA

## 2019-01-18 NOTE — Telephone Encounter (Signed)
Patient states that he has been having ear pain for a couple of months. Wanted to see about getting a referral for an ENT and also discuss medication. Offered a virtual visit to discuss this patient declined. Wanted to speak to triage nurse about this and wanted to send message to PCP about this. Please contact patient.

## 2019-01-18 NOTE — Patient Instructions (Signed)

## 2020-02-15 ENCOUNTER — Other Ambulatory Visit: Payer: Self-pay | Admitting: Osteopathic Medicine

## 2020-02-29 ENCOUNTER — Other Ambulatory Visit: Payer: Self-pay | Admitting: Osteopathic Medicine

## 2020-02-29 DIAGNOSIS — G40909 Epilepsy, unspecified, not intractable, without status epilepticus: Secondary | ICD-10-CM

## 2020-03-11 ENCOUNTER — Other Ambulatory Visit: Payer: Self-pay | Admitting: Osteopathic Medicine

## 2020-03-11 DIAGNOSIS — G40909 Epilepsy, unspecified, not intractable, without status epilepticus: Secondary | ICD-10-CM

## 2020-03-13 ENCOUNTER — Other Ambulatory Visit: Payer: Self-pay

## 2020-03-13 ENCOUNTER — Other Ambulatory Visit: Payer: Self-pay | Admitting: Osteopathic Medicine

## 2020-03-13 DIAGNOSIS — G40909 Epilepsy, unspecified, not intractable, without status epilepticus: Secondary | ICD-10-CM

## 2020-03-13 MED ORDER — CARBAMAZEPINE 200 MG PO TABS: 200.0000 mg | ORAL_TABLET | Freq: Every day | ORAL | 0 refills | Status: DC

## 2020-03-13 NOTE — Telephone Encounter (Signed)
Medication refill request  Last refill -01/18/2020 Last Ov- 01/18/2020

## 2020-03-13 NOTE — Telephone Encounter (Signed)
I will refill this once for 2 weeks worth but he needs Tegretol levels.

## 2020-03-14 NOTE — Telephone Encounter (Signed)
Patient made aware, Patient will need lab orders put in. Patient advised that we will contact him when they are in. Patient voiced his understanding.

## 2020-03-15 ENCOUNTER — Telehealth: Payer: Self-pay | Admitting: *Deleted

## 2020-03-15 DIAGNOSIS — G40909 Epilepsy, unspecified, not intractable, without status epilepticus: Secondary | ICD-10-CM | POA: Diagnosis not present

## 2020-03-15 NOTE — Telephone Encounter (Signed)
I placed the lab order and sent pt downstairs.

## 2020-03-15 NOTE — Telephone Encounter (Signed)
Dr T, can you please place the order for lab work?

## 2020-03-16 LAB — CARBAMAZEPINE LEVEL, TOTAL: Carbamazepine Lvl: 4.6 mg/L (ref 4.0–12.0)

## 2020-03-21 ENCOUNTER — Ambulatory Visit (INDEPENDENT_AMBULATORY_CARE_PROVIDER_SITE_OTHER): Payer: Medicare Other | Admitting: Osteopathic Medicine

## 2020-03-21 ENCOUNTER — Encounter: Payer: Self-pay | Admitting: Osteopathic Medicine

## 2020-03-21 ENCOUNTER — Other Ambulatory Visit: Payer: Self-pay

## 2020-03-21 VITALS — BP 130/81 | HR 94 | Wt 200.0 lb

## 2020-03-21 DIAGNOSIS — K219 Gastro-esophageal reflux disease without esophagitis: Secondary | ICD-10-CM

## 2020-03-21 DIAGNOSIS — Z0000 Encounter for general adult medical examination without abnormal findings: Secondary | ICD-10-CM

## 2020-03-21 DIAGNOSIS — R7301 Impaired fasting glucose: Secondary | ICD-10-CM

## 2020-03-21 DIAGNOSIS — K635 Polyp of colon: Secondary | ICD-10-CM | POA: Diagnosis not present

## 2020-03-21 DIAGNOSIS — G40909 Epilepsy, unspecified, not intractable, without status epilepticus: Secondary | ICD-10-CM | POA: Diagnosis not present

## 2020-03-21 DIAGNOSIS — Z125 Encounter for screening for malignant neoplasm of prostate: Secondary | ICD-10-CM

## 2020-03-21 MED ORDER — FAMOTIDINE 20 MG PO TABS
20.0000 mg | ORAL_TABLET | Freq: Every day | ORAL | 3 refills | Status: DC
Start: 2020-03-21 — End: 2020-03-21

## 2020-03-21 MED ORDER — FAMOTIDINE 20 MG PO TABS
20.0000 mg | ORAL_TABLET | Freq: Two times a day (BID) | ORAL | 3 refills | Status: DC
Start: 2020-03-21 — End: 2021-03-04

## 2020-03-21 MED ORDER — CARBAMAZEPINE 200 MG PO TABS: 200.0000 mg | ORAL_TABLET | Freq: Every day | ORAL | 3 refills | Status: DC

## 2020-03-21 NOTE — Patient Instructions (Addendum)
General Preventive Care  Most recent routine screening labs: ordered.   Blood pressure goal 140/90 or less.   Tobacco: don't!   Alcohol: responsible moderation is ok for most adults - if you have concerns about your alcohol intake, please talk to me!   Exercise: as tolerated to reduce risk of cardiovascular disease and diabetes. Strength training will also prevent osteoporosis.   Mental health: if need for mental health care (medicines, counseling, other), or concerns about moods, please let me know!   Sexual / Reproductive health: if need for STD testing, or if concerns with libido/pain problems, please let me know!   Advanced Directive: Living Will and/or Healthcare Power of Attorney recommended for all adults, regardless of age or health.  Vaccines  Flu vaccine: for almost everyone, every fall.   Shingles vaccine: after age 83.   Pneumonia vaccines: after age 96.  Tetanus booster: every 10 years, due 2025  COVID vaccine: STRONGLY RECOMMENDED  Cancer screenings   Colon cancer screening: for everyone age 22-75. Colonoscopy due 2023  Prostate cancer screening: PSA blood test age 32-71  Lung cancer screening: CT chest every year for those aged 97 to 44 years who have a 20 pack-year smoking history and currently smoke or have quit within the past 15 years  Infection screenings  . HIV: recommended screening at least once age 36-65 . Gonorrhea/Chlamydia: screening as needed . Hepatitis C: recommended once for everyone age 98-75 . TB: certain at-risk populations, or depending on work requirements and/or travel history Other . Bone Density Test: recommended for men at age 24 . Abdominal Aortic Aneurysm: screening with ultrasound recommended once for men age 10-75 who have ever smoked

## 2020-03-21 NOTE — Progress Notes (Signed)
Robert Hodges is a 69 y.o. male who presents to  [place] at [facility]  today, 03/21/20, seeking care for the following:  . Annual physical      ASSESSMENT & PLAN with other pertinent findings:  The primary encounter diagnosis was Annual physical exam. Diagnoses of Nonintractable epilepsy without status epilepticus, unspecified epilepsy type ([place]), Impaired fasting glucose, Polyp of colon, unspecified part of colon, unspecified type, Gastroesophageal reflux disease, unspecified whether esophagitis present, and Prostate cancer screening were also pertinent to this visit.   No results found for this or any previous visit (from the past 24 hour(s)).     Patient Instructions  General Preventive Care  Most recent routine screening labs: ordered.   Blood pressure goal 140/90 or less.   Tobacco: don't!   Alcohol: responsible moderation is ok for most adults - if you have concerns about your alcohol intake, please talk to me!   Exercise: as tolerated to reduce risk of cardiovascular disease and diabetes. Strength training will also prevent osteoporosis.   Mental health: if need for mental health care (medicines, counseling, other), or concerns about moods, please let me know!   Sexual / Reproductive health: if need for STD testing, or if concerns with libido/pain problems, please let me know!   Advanced Directive: Living Will and/or Healthcare Power of Attorney recommended for all adults, regardless of age or health.  Vaccines  Flu vaccine: for almost everyone, every fall.   Shingles vaccine: after age 56.   Pneumonia vaccines: after age 62.  Tetanus booster: every 10 years, due 2025  COVID vaccine: STRONGLY RECOMMENDED  Cancer screenings   Colon cancer screening: for everyone age 70-75. Colonoscopy due 2023  Prostate cancer screening: PSA blood test age 47-71  Lung cancer screening: CT chest every year for those aged 55  to 59 years who have a 20 pack-year smoking history and currently smoke or have quit within the past 15 years  Infection screenings  . HIV: recommended screening at least once age 75-65 . Gonorrhea/Chlamydia: screening as needed . Hepatitis C: recommended once for everyone age 17-75 . TB: certain at-risk populations, or depending on work requirements and/or travel history Other . Bone Density Test: recommended for men at age 2 . Abdominal Aortic Aneurysm: screening with ultrasound recommended once for men age 20-75 who have ever smoked    Orders Placed This Encounter  Procedures  . CBC  . COMPLETE METABOLIC PANEL WITH GFR  . Lipid panel  . Hemoglobin A1c  . PSA, Total with Reflex to PSA, Free  . Hepatitis C antibody    Meds ordered this encounter  Medications  . carbamazepine (TEGRETOL) 200 MG tablet    Sig: Take 1 tablet (200 mg total) by mouth daily.    Dispense:  90 tablet    Refill:  3  . DISCONTD: famotidine (PEPCID) 20 MG tablet    Sig: Take 1 tablet (20 mg total) by mouth daily.    Dispense:  90 tablet    Refill:  3  . famotidine (PEPCID) 20 MG tablet    Sig: Take 1 tablet (20 mg total) by mouth 2 (two) times daily.    Dispense:  180 tablet    Refill:  3    CANCEL THE DAILY DOSE       Follow-up instructions: Return in about 1 year (around 03/21/2021) for ANNUAL (call week prior to visit for lab orders).  Immunization History  Administered Date(s) Administered  . Td 06/30/2010  . Tdap 01/12/2014      BP 130/81 (BP Location: Left Arm, Patient Position: Sitting)   Pulse 94   Wt 200 lb (90.7 kg)   SpO2 97%   BMI 27.89 kg/m   Current Meds  Medication Sig  . carbamazepine (TEGRETOL) 200 MG tablet Take 1 tablet (200 mg total) by mouth daily.  . famotidine (PEPCID) 20 MG tablet Take 1 tablet (20 mg total) by mouth 2 (two) times daily.  . [DISCONTINUED] carbamazepine (TEGRETOL) 200 MG  tablet Take 1 tablet (200 mg total) by mouth daily.  . [DISCONTINUED] famotidine (PEPCID) 20 MG tablet Take 1 tablet (20 mg total) by mouth daily.  . [DISCONTINUED] famotidine (PEPCID) 20 MG tablet Take 1 tablet (20 mg total) by mouth daily.  . [DISCONTINUED] ipratropium (ATROVENT) 0.06 % nasal spray Place 2 sprays into both nostrils 4 (four) times daily.    No results found for this or any previous visit (from the past 72 hour(s)).  No results found.     All questions at time of visit were answered - patient instructed to contact office with any additional concerns or updates.  ER/RTC precautions were reviewed with the patient as applicable.   Please note: voice recognition software was used to produce this document, and typos may escape review. Please contact Dr. Sheppard Coil for any needed clarifications.

## 2020-04-29 DIAGNOSIS — Z0120 Encounter for dental examination and cleaning without abnormal findings: Secondary | ICD-10-CM | POA: Diagnosis not present

## 2020-12-12 DIAGNOSIS — L711 Rhinophyma: Secondary | ICD-10-CM | POA: Diagnosis not present

## 2021-02-13 ENCOUNTER — Telehealth: Payer: Self-pay | Admitting: Osteopathic Medicine

## 2021-02-13 NOTE — Telephone Encounter (Signed)
Copied from [place] [id number]. Topic: Medicare AWV >> Feb 13, 2021  4:23 PM Cher Nakai R wrote: Reason for CRM:  Left message for patient to call back and schedule their Medicare Annual Wellness Visit (AWV) either virtually or by telephone.  Please schedule at any time with Scott. Saturdays are available to schedule in the month of September.   Last Medicare AWVS: 02/08/2018   40-minute appointment   Any questions, please contact me at [phone]

## 2021-02-14 ENCOUNTER — Telehealth: Payer: Self-pay | Admitting: Family Medicine

## 2021-02-14 DIAGNOSIS — Z13220 Encounter for screening for lipoid disorders: Secondary | ICD-10-CM

## 2021-02-14 DIAGNOSIS — R7301 Impaired fasting glucose: Secondary | ICD-10-CM

## 2021-02-14 DIAGNOSIS — Z125 Encounter for screening for malignant neoplasm of prostate: Secondary | ICD-10-CM

## 2021-02-14 DIAGNOSIS — Z0000 Encounter for general adult medical examination without abnormal findings: Secondary | ICD-10-CM

## 2021-02-14 DIAGNOSIS — Z5181 Encounter for therapeutic drug level monitoring: Secondary | ICD-10-CM

## 2021-02-14 DIAGNOSIS — Z1159 Encounter for screening for other viral diseases: Secondary | ICD-10-CM

## 2021-02-14 DIAGNOSIS — G40909 Epilepsy, unspecified, not intractable, without status epilepticus: Secondary | ICD-10-CM

## 2021-02-14 NOTE — Telephone Encounter (Signed)
Pt called.  He has transferred his care from  Dr. Sheppard Coil to Dr. Zigmund Daniel.  He has  a physical scheduled with Dr. Zigmund Daniel on 9/20 and he is on the lab schedule for 9/13. He wants labs to include check on his tegretol levels.  Thank you.

## 2021-02-22 ENCOUNTER — Ambulatory Visit (INDEPENDENT_AMBULATORY_CARE_PROVIDER_SITE_OTHER): Payer: Medicare Other

## 2021-02-22 ENCOUNTER — Other Ambulatory Visit: Payer: Self-pay

## 2021-02-22 DIAGNOSIS — Z0000 Encounter for general adult medical examination without abnormal findings: Secondary | ICD-10-CM | POA: Diagnosis not present

## 2021-02-22 NOTE — Progress Notes (Addendum)
Virtual Visit via Telephone Note  I connected with  Robert Hodges on 02/22/21 at 11:00 AM EDT by telephone and verified that I am speaking with the correct person using two identifiers.  Medicare Annual Wellness visit completed telephonically due to Covid-19 pandemic.   Persons participating in this call: This Health Coach and this patient.   Location: Patient: home Provider: office   I discussed the limitations, risks, security and privacy concerns of performing an evaluation and management service by telephone and the availability of in person appointments. The patient expressed understanding and agreed to proceed.  Unable to perform video visit due to video visit attempted and failed and/or patient does not have video capability.   Some vital signs may be absent or patient reported.   Willette Brace, LPN   Subjective:   Robert Hodges is a 70 y.o. male who presents for Medicare Annual/Subsequent preventive examination.  Review of Systems     Cardiac Risk Factors include: advanced age (>11mn, >>9women);male gender;dyslipidemia     Objective:    There were no vitals filed for this visit. There is no height or weight on file to calculate BMI.  Advanced Directives 02/22/2021  Does Patient Have a Medical Advance Directive? No    Current Medications (verified) Outpatient Encounter Medications as of 02/22/2021  Medication Sig   carbamazepine (TEGRETOL) 200 MG tablet Take 1 tablet (200 mg total) by mouth daily.   famotidine (PEPCID) 20 MG tablet Take 1 tablet (20 mg total) by mouth 2 (two) times daily.   metroNIDAZOLE (METROGEL) 0.75 % gel Apply topically 2 (two) times daily.   minocycline (MINOCIN) 100 MG capsule Take 100 mg by mouth 2 (two) times daily.   No facility-administered encounter medications on file as of 02/22/2021.    Allergies (verified) Patient has no known allergies.   History: Past Medical History:  Diagnosis Date   Seizure disorder ([facility]     Past Surgical History:  Procedure Laterality Date   pudendal nerve  pudendal nerve    surgery   History reviewed. No pertinent family history. Social History   Socioeconomic History   Marital status: Married    Spouse name: Not on file   Number of children: Not on file   Years of education: Not on file   Highest education level: Not on file  Occupational History   Not on file  Tobacco Use   Smoking status: Former   Smokeless tobacco: Never  Substance and Sexual Activity   Alcohol use: Yes    Comment: occassional   Drug use: Yes    Types: Other-see comments    Comment: cbd gummies   Sexual activity: Not Currently    Partners: Female  Other Topics Concern   Not on file  Social History Narrative   Not on file   Social Determinants of Health   Financial Resource Strain: Low Risk    Difficulty of Paying Living Expenses: Not hard at all  Food Insecurity: No Food Insecurity   Worried About RCharity fundraiserin the Last Year: Never true   RMandareein the Last Year: Never true  Transportation Needs: No Transportation Needs   Lack of Transportation (Medical): No   Lack of Transportation (Non-Medical): No  Physical Activity: Sufficiently Active   Days of Exercise per Week: 5 days   Minutes of Exercise per Session: 30 min  Stress: No Stress Concern Present   Feeling of Stress : Not at all  Social Connections: Moderately Integrated   Frequency of Communication with Friends and Family: Once a week   Frequency of Social Gatherings with Friends and Family: Never   Attends Religious Services: More than 4 times per year   Active Member of [employer] or Organizations: Yes   Attends Archivist Meetings: 1 to 4 times per year   Marital Status: Married    Tobacco Counseling Counseling given: Not Answered   Clinical Intake:  Pre-visit preparation completed: Yes  Pain : No/denies pain     BMI - recorded: 27.89 Nutritional Status: BMI 25 -29  Overweight Nutritional Risks: None Diabetes: No  How often do you need to have someone help you when you read instructions, pamphlets, or other written materials from your doctor or pharmacy?: 1 - Never  Diabetic?No  Interpreter Needed?: No  Information entered by :: Charlott Rakes, LPN   Activities of Daily Living In your present state of health, do you have any difficulty performing the following activities: 02/22/2021  Hearing? N  Vision? N  Difficulty concentrating or making decisions? N  Walking or climbing stairs? N  Dressing or bathing? N  Doing errands, shopping? N  Preparing Food and eating ? N  Using the Toilet? N  In the past six months, have you accidently leaked urine? N  Do you have problems with loss of bowel control? N  Managing your Medications? N  Managing your Finances? N  Housekeeping or managing your Housekeeping? N  Some recent data might be hidden    Patient Care Team: Luetta Nutting, DO as PCP - General (Family Medicine)  Indicate any recent Medical Services you may have received from other than [facility] providers in the past year (date may be approximate).     Assessment:   This is a routine wellness examination for [employer].  Hearing/Vision screen Hearing Screening - Comments:: Pt denies any hearing issues  Vision Screening - Comments:: Pt follows up with a provider Dr Jodi Mourning in [place] for annual eye exams   Dietary issues and exercise activities discussed: Current Exercise Habits: Home exercise routine, Type of exercise: walking;Other - see comments (yard work), Time (Minutes): 30, Frequency (Times/Week): 5, Weekly Exercise (Minutes/Week): 150   Goals Addressed             This Visit's Progress    Patient Stated       Lose a few lbs       Depression Screen PHQ 2/9 Scores 02/22/2021 08/12/2017 02/11/2017 09/02/2016 08/31/2016  PHQ - 2 Score 0 0 0 0 0  PHQ- 9 Score - 1 0 - -    Fall Risk Fall Risk  02/22/2021 08/31/2016  Falls in the  past year? 0 No  Number falls in past yr: 0 -  Injury with Fall? 0 -  Follow up Falls prevention discussed -    FALL RISK PREVENTION PERTAINING TO THE HOME:  Any stairs in or around the home? No  If so, are there any without handrails? No  Home free of loose throw rugs in walkways, pet beds, electrical cords, etc? Yes  Adequate lighting in your home to reduce risk of falls? Yes   ASSISTIVE DEVICES UTILIZED TO PREVENT FALLS:  Life alert? No  Use of a cane, walker or w/c? No  Grab bars in the bathroom? No  Shower chair or bench in shower? Yes Elevated toilet seat or a handicapped toilet? No   TIMED UP AND GO:  Was the test performed? No .  Cognitive Function:     6CIT Screen 02/22/2021  What Year? 0 points  What month? 0 points  What time? 0 points  Count back from 20 0 points  Months in reverse 0 points  Repeat phrase 0 points  Total Score 0    Immunizations Immunization History  Administered Date(s) Administered   Td 06/30/2010   Tdap 01/12/2014    TDAP status: Up to date  Flu Vaccine status: Declined, Education has been provided regarding the importance of this vaccine but patient still declined. Advised may receive this vaccine at local pharmacy or Health Dept. Aware to provide a copy of the vaccination record if obtained from local pharmacy or Health Dept. Verbalized acceptance and understanding.  Pneumococcal vaccine status: Due, Education has been provided regarding the importance of this vaccine. Advised may receive this vaccine at local pharmacy or Health Dept. Aware to provide a copy of the vaccination record if obtained from local pharmacy or Health Dept. Verbalized acceptance and understanding.  Covid-19 vaccine status: Declined, Education has been provided regarding the importance of this vaccine but patient still declined. Advised may receive this vaccine at local pharmacy or Health Dept.or vaccine clinic. Aware to provide a copy of the vaccination  record if obtained from local pharmacy or Health Dept. Verbalized acceptance and understanding.  Qualifies for Shingles Vaccine? Yes   Zostavax completed No   Shingrix Completed?: No.    Education has been provided regarding the importance of this vaccine. Patient has been advised to call insurance company to determine out of pocket expense if they have not yet received this vaccine. Advised may also receive vaccine at local pharmacy or Health Dept. Verbalized acceptance and understanding.  Screening Tests Health Maintenance  Topic Date Due   Hepatitis C Screening  Never done   Zoster Vaccines- Shingrix (1 of 2) Never done   PNA vac Low Risk Adult (1 of 2 - PCV13) Never done   COVID-19 Vaccine (1) 03/10/2021 (Originally 07/09/1951)   COLONOSCOPY (Pts 45-21yr Insurance coverage will need to be confirmed)  11/05/2021   TETANUS/TDAP  01/13/2024   HPV VACCINES  Aged Out   INFLUENZA VACCINE  Discontinued    Health Maintenance  Health Maintenance Due  Topic Date Due   Hepatitis C Screening  Never done   Zoster Vaccines- Shingrix (1 of 2) Never done   PNA vac Low Risk Adult (1 of 2 - PCV13) Never done    Colorectal cancer screening: Type of screening: Colonoscopy. Completed 11/05/16. Repeat every 5 years  Additional Screening:  Hepatitis C Screening: does qualify;  Vision Screening: Recommended annual ophthalmology exams for early detection of glaucoma and other disorders of the eye. Is the patient up to date with their annual eye exam?  Yes  Who is the provider or what is the name of the office in which the patient attends annual eye exams? Dr HJodi MourningIf pt is not established with a provider, would they like to be referred to a provider to establish care? No .   Dental Screening: Recommended annual dental exams for proper oral hygiene  Community Resource Referral / Chronic Care Management: CRR required this visit?  No   CCM required this visit?  No      Plan:     I have  personally reviewed and noted the following in the patient's chart:   Medical and social history Use of alcohol, tobacco or illicit drugs  Current medications and supplements including opioid prescriptions. Patient is not currently taking  opioid prescriptions. Functional ability and status Nutritional status Physical activity Advanced directives List of other physicians Hospitalizations, surgeries, and ER visits in previous 12 months Vitals Screenings to include cognitive, depression, and falls Referrals and appointments  In addition, I have reviewed and discussed with patient certain preventive protocols, quality metrics, and best practice recommendations. A written personalized care plan for preventive services as well as general preventive health recommendations were provided to patient.     Willette Brace, LPN   [id number]   Nurse Notes: None

## 2021-02-22 NOTE — Patient Instructions (Signed)
[name] , Thank you for taking time to come for your Medicare Wellness Visit. I appreciate your ongoing commitment to your health goals. Please review the following plan we discussed and let me know if I can assist you in the future.   Screening recommendations/referrals: Colonoscopy: Done 11/05/16 repeat every 5 years due 11/06/26 Recommended yearly ophthalmology/optometry visit for glaucoma screening and checkup Recommended yearly dental visit for hygiene and checkup  Vaccinations: Influenza vaccine: Discontinued  Pneumococcal vaccine: Due and discussed Tdap vaccine: Done 01/12/14 repeat every 10 years due 01/13/24 Shingles vaccine: Shingrix discussed. Please contact your pharmacy for coverage information.    Covid-19: Declined at this time  Advanced directives: Advance directive discussed with you today. I have provided a copy for you to complete at home and have notarized. Once this is complete please bring a copy in to our office so we can scan it into your chart.  Conditions/risks identified: Lose a few lbs  Next appointment: Follow up in one year for your annual wellness visit.   Preventive Care 41 Years and Older, Male Preventive care refers to lifestyle choices and visits with your health care provider that can promote health and wellness. What does preventive care include? A yearly physical exam. This is also called an annual well check. Dental exams once or twice a year. Routine eye exams. Ask your health care provider how often you should have your eyes checked. Personal lifestyle choices, including: Daily care of your teeth and gums. Regular physical activity. Eating a healthy diet. Avoiding tobacco and drug use. Limiting alcohol use. Practicing safe sex. Taking low doses of aspirin every day. Taking vitamin and mineral supplements as recommended by your health care provider. What happens during an annual well check? The services and screenings done by your health  care provider during your annual well check will depend on your age, overall health, lifestyle risk factors, and family history of disease. Counseling  Your health care provider may ask you questions about your: Alcohol use. Tobacco use. Drug use. Emotional well-being. Home and relationship well-being. Sexual activity. Eating habits. History of falls. Memory and ability to understand (cognition). Work and work Statistician. Screening  You may have the following tests or measurements: Height, weight, and BMI. Blood pressure. Lipid and cholesterol levels. These may be checked every 5 years, or more frequently if you are over 72 years old. Skin check. Lung cancer screening. You may have this screening every year starting at age 72 if you have a 30-pack-year history of smoking and currently smoke or have quit within the past 15 years. Fecal occult blood test (FOBT) of the stool. You may have this test every year starting at age 72. Flexible sigmoidoscopy or colonoscopy. You may have a sigmoidoscopy every 5 years or a colonoscopy every 10 years starting at age 72. Prostate cancer screening. Recommendations will vary depending on your family history and other risks. Hepatitis C blood test. Hepatitis B blood test. Sexually transmitted disease (STD) testing. Diabetes screening. This is done by checking your blood sugar (glucose) after you have not eaten for a while (fasting). You may have this done every 1-3 years. Abdominal aortic aneurysm (AAA) screening. You may need this if you are a current or former smoker. Osteoporosis. You may be screened starting at age 72 if you are at high risk. Talk with your health care provider about your test results, treatment options, and if necessary, the need for more tests. Vaccines  Your health care provider may recommend certain  vaccines, such as: Influenza vaccine. This is recommended every year. Tetanus, diphtheria, and acellular pertussis (Tdap, Td)  vaccine. You may need a Td booster every 10 years. Zoster vaccine. You may need this after age 72. Pneumococcal 13-valent conjugate (PCV13) vaccine. One dose is recommended after age 72. Pneumococcal polysaccharide (PPSV23) vaccine. One dose is recommended after age 72. Talk to your health care provider about which screenings and vaccines you need and how often you need them. This information is not intended to replace advice given to you by your health care provider. Make sure you discuss any questions you have with your health care provider. Document Released: 06/28/2015 Document Revised: 02/19/2016 Document Reviewed: 04/02/2015 Elsevier Interactive Patient Education  2017 [employer] Prevention in the Home Falls can cause injuries. They can happen to people of all ages. There are many things you can do to make your home safe and to help prevent falls. What can I do on the outside of my home? Regularly fix the edges of walkways and driveways and fix any cracks. Remove anything that might make you trip as you walk through a door, such as a raised step or threshold. Trim any bushes or trees on the path to your home. Use bright outdoor lighting. Clear any walking paths of anything that might make someone trip, such as rocks or tools. Regularly check to see if handrails are loose or broken. Make sure that both sides of any steps have handrails. Any raised decks and porches should have guardrails on the edges. Have any leaves, snow, or ice cleared regularly. Use sand or salt on walking paths during winter. Clean up any spills in your garage right away. This includes oil or grease spills. What can I do in the bathroom? Use night lights. Install grab bars by the toilet and in the tub and shower. Do not use towel bars as grab bars. Use non-skid mats or decals in the tub or shower. If you need to sit down in the shower, use a plastic, non-slip stool. Keep the floor dry. Clean up any  water that spills on the floor as soon as it happens. Remove soap buildup in the tub or shower regularly. Attach bath mats securely with double-sided non-slip rug tape. Do not have throw rugs and other things on the floor that can make you trip. What can I do in the bedroom? Use night lights. Make sure that you have a light by your bed that is easy to reach. Do not use any sheets or blankets that are too big for your bed. They should not hang down onto the floor. Have a firm chair that has side arms. You can use this for support while you get dressed. Do not have throw rugs and other things on the floor that can make you trip. What can I do in the kitchen? Clean up any spills right away. Avoid walking on wet floors. Keep items that you use a lot in easy-to-reach places. If you need to reach something above you, use a strong step stool that has a grab bar. Keep electrical cords out of the way. Do not use floor polish or wax that makes floors slippery. If you must use wax, use non-skid floor wax. Do not have throw rugs and other things on the floor that can make you trip. What can I do with my stairs? Do not leave any items on the stairs. Make sure that there are handrails on both sides of the stairs  and use them. Fix handrails that are broken or loose. Make sure that handrails are as long as the stairways. Check any carpeting to make sure that it is firmly attached to the stairs. Fix any carpet that is loose or worn. Avoid having throw rugs at the top or bottom of the stairs. If you do have throw rugs, attach them to the floor with carpet tape. Make sure that you have a light switch at the top of the stairs and the bottom of the stairs. If you do not have them, ask someone to add them for you. What else can I do to help prevent falls? Wear shoes that: Do not have high heels. Have rubber bottoms. Are comfortable and fit you well. Are closed at the toe. Do not wear sandals. If you use a  stepladder: Make sure that it is fully opened. Do not climb a closed stepladder. Make sure that both sides of the stepladder are locked into place. Ask someone to hold it for you, if possible. Clearly mark and make sure that you can see: Any grab bars or handrails. First and last steps. Where the edge of each step is. Use tools that help you move around (mobility aids) if they are needed. These include: Canes. Walkers. Scooters. Crutches. Turn on the lights when you go into a dark area. Replace any light bulbs as soon as they burn out. Set up your furniture so you have a clear path. Avoid moving your furniture around. If any of your floors are uneven, fix them. If there are any pets around you, be aware of where they are. Review your medicines with your doctor. Some medicines can make you feel dizzy. This can increase your chance of falling. Ask your doctor what other things that you can do to help prevent falls. This information is not intended to replace advice given to you by your health care provider. Make sure you discuss any questions you have with your health care provider. Document Released: 03/28/2009 Document Revised: 11/07/2015 Document Reviewed: 07/06/2014 Elsevier Interactive Patient Education  2017 [employer].

## 2021-02-25 ENCOUNTER — Other Ambulatory Visit: Payer: Medicare Other

## 2021-02-25 NOTE — Telephone Encounter (Signed)
Patient has been advised of lab orders.

## 2021-02-26 ENCOUNTER — Other Ambulatory Visit: Payer: Self-pay

## 2021-02-26 ENCOUNTER — Other Ambulatory Visit: Payer: Medicare Other

## 2021-02-26 DIAGNOSIS — Z0000 Encounter for general adult medical examination without abnormal findings: Secondary | ICD-10-CM | POA: Diagnosis not present

## 2021-02-26 DIAGNOSIS — R7301 Impaired fasting glucose: Secondary | ICD-10-CM | POA: Diagnosis not present

## 2021-02-26 DIAGNOSIS — Z1159 Encounter for screening for other viral diseases: Secondary | ICD-10-CM | POA: Diagnosis not present

## 2021-02-26 DIAGNOSIS — Z125 Encounter for screening for malignant neoplasm of prostate: Secondary | ICD-10-CM | POA: Diagnosis not present

## 2021-02-27 DIAGNOSIS — L719 Rosacea, unspecified: Secondary | ICD-10-CM | POA: Diagnosis not present

## 2021-02-27 LAB — CBC WITH DIFFERENTIAL/PLATELET
Absolute Monocytes: 493 cells/uL (ref 200–950)
Basophils Absolute: 28 cells/uL (ref 0–200)
Basophils Relative: 0.5 %
Eosinophils Absolute: 174 cells/uL (ref 15–500)
Eosinophils Relative: 3.1 %
HCT: 44.6 % (ref 38.5–50.0)
Hemoglobin: 14.8 g/dL (ref 13.2–17.1)
Lymphs Abs: 1663 cells/uL (ref 850–3900)
MCH: 30.9 pg (ref 27.0–33.0)
MCHC: 33.2 g/dL (ref 32.0–36.0)
MCV: 93.1 fL (ref 80.0–100.0)
MPV: 10.1 fL (ref 7.5–12.5)
Monocytes Relative: 8.8 %
Neutro Abs: 3242 cells/uL (ref 1500–7800)
Neutrophils Relative %: 57.9 %
Platelets: 219 10*3/uL (ref 140–400)
RBC: 4.79 10*6/uL (ref 4.20–5.80)
RDW: 12.4 % (ref 11.0–15.0)
Total Lymphocyte: 29.7 %
WBC: 5.6 10*3/uL (ref 3.8–10.8)

## 2021-02-27 LAB — CARBAMAZEPINE LEVEL, TOTAL: Carbamazepine Lvl: 5.4 mg/L (ref 4.0–12.0)

## 2021-02-27 LAB — COMPLETE METABOLIC PANEL WITH GFR
AG Ratio: 1.7 (calc) (ref 1.0–2.5)
ALT: 19 U/L (ref 9–46)
AST: 14 U/L (ref 10–35)
Albumin: 4.4 g/dL (ref 3.6–5.1)
Alkaline phosphatase (APISO): 74 U/L (ref 35–144)
BUN: 14 mg/dL (ref 7–25)
CO2: 28 mmol/L (ref 20–32)
Calcium: 9.3 mg/dL (ref 8.6–10.3)
Chloride: 103 mmol/L (ref 98–110)
Creat: 1.08 mg/dL (ref 0.70–1.28)
Globulin: 2.6 g/dL (calc) (ref 1.9–3.7)
Glucose, Bld: 134 mg/dL — ABNORMAL HIGH (ref 65–99)
Potassium: 4.4 mmol/L (ref 3.5–5.3)
Sodium: 140 mmol/L (ref 135–146)
Total Bilirubin: 0.4 mg/dL (ref 0.2–1.2)
Total Protein: 7 g/dL (ref 6.1–8.1)
eGFR: 73 mL/min/{1.73_m2} (ref >=60)

## 2021-02-27 LAB — LIPID PANEL W/REFLEX DIRECT LDL
Cholesterol: 289 mg/dL — ABNORMAL HIGH (ref <200)
HDL: 46 mg/dL (ref >=40)
LDL Cholesterol (Calc): 192 mg/dL (calc) — ABNORMAL HIGH
Non-HDL Cholesterol (Calc): 243 mg/dL (calc) — ABNORMAL HIGH (ref <130)
Total CHOL/HDL Ratio: 6.3 (calc) — ABNORMAL HIGH (ref <5.0)
Triglycerides: 284 mg/dL — ABNORMAL HIGH (ref <150)

## 2021-02-27 LAB — HEPATITIS C ANTIBODY
Hepatitis C Ab: NONREACTIVE
SIGNAL TO CUT-OFF: 0.01 (ref <1.00)

## 2021-02-27 LAB — HEMOGLOBIN A1C
Hgb A1c MFr Bld: 6.3 % of total Hgb — ABNORMAL HIGH (ref <5.7)
Mean Plasma Glucose: 134 mg/dL
eAG (mmol/L): 7.4 mmol/L

## 2021-02-27 LAB — PSA: PSA: 1.1 ng/mL (ref <=4.00)

## 2021-03-04 ENCOUNTER — Ambulatory Visit (INDEPENDENT_AMBULATORY_CARE_PROVIDER_SITE_OTHER): Payer: Medicare Other | Admitting: Family Medicine

## 2021-03-04 ENCOUNTER — Encounter: Payer: Self-pay | Admitting: Family Medicine

## 2021-03-04 ENCOUNTER — Other Ambulatory Visit: Payer: Self-pay

## 2021-03-04 DIAGNOSIS — G40909 Epilepsy, unspecified, not intractable, without status epilepticus: Secondary | ICD-10-CM | POA: Diagnosis not present

## 2021-03-04 DIAGNOSIS — E7800 Pure hypercholesterolemia, unspecified: Secondary | ICD-10-CM

## 2021-03-04 DIAGNOSIS — Z0000 Encounter for general adult medical examination without abnormal findings: Secondary | ICD-10-CM | POA: Insufficient documentation

## 2021-03-04 DIAGNOSIS — R7301 Impaired fasting glucose: Secondary | ICD-10-CM

## 2021-03-04 MED ORDER — CARBAMAZEPINE 200 MG PO TABS: 200.0000 mg | ORAL_TABLET | Freq: Every day | ORAL | 3 refills | Status: DC

## 2021-03-04 MED ORDER — FAMOTIDINE 20 MG PO TABS: 20.0000 mg | ORAL_TABLET | Freq: Two times a day (BID) | ORAL | 3 refills | Status: DC

## 2021-03-04 NOTE — Assessment & Plan Note (Signed)
Well adult Immunizations: Flu and shingles vaccines are recommended however he declines. Screenings: Up-to-date Anticipatory guidance/risk factor reduction: Recommendations per AVS.

## 2021-03-04 NOTE — Assessment & Plan Note (Signed)
Tegretol renewed.

## 2021-03-04 NOTE — Progress Notes (Signed)
Robert Hodges - 70 y.o. male MRN [id number]  Date of birth: September 07, 1950  Subjective Chief Complaint  Patient presents with   Annual Exam    HPI Robert Hodges is a 70 year old male here today for annual exam.  He has history of seizure disorder and hyperlipidemia.  He has been on carbamazepine for several years without recurrent seizures.  He had labs completed prior to visit which we reviewed today.  His cholesterol mains elevated.  He does not want to start a statin medication due to concern about memory issues related to this.  Blood sugars are also elevated however A1c is slightly improved at 6.3.  He has a non-smoker.  He consumes alcohol occasionally.  He stays pretty active however does not exercise regularly.  He admits that diet could be improved.  He does not want flu or shingles vaccine.  Review of Systems  Constitutional:  Negative for chills, fever, malaise/fatigue and weight loss.  HENT:  Negative for congestion, ear pain and sore throat.   Eyes:  Negative for blurred vision, double vision and pain.  Respiratory:  Negative for cough and shortness of breath.   Cardiovascular:  Negative for chest pain and palpitations.  Gastrointestinal:  Negative for abdominal pain, blood in stool, constipation, heartburn and nausea.  Genitourinary:  Negative for dysuria and urgency.  Musculoskeletal:  Negative for joint pain and myalgias.  Neurological:  Negative for dizziness and headaches.  Endo/Heme/Allergies:  Does not bruise/bleed easily.  Psychiatric/Behavioral:  Negative for depression. The patient is not nervous/anxious and does not have insomnia.    No Known Allergies  Past Medical History:  Diagnosis Date   Seizure disorder [facility])     Past Surgical History:  Procedure Laterality Date   pudendal nerve  pudendal nerve    surgery    Social History   Socioeconomic History   Marital status: Married    Spouse name: Not on file   Number of children: Not on file   Years of  education: Not on file   Highest education level: Not on file  Occupational History   Not on file  Tobacco Use   Smoking status: Former   Smokeless tobacco: Never  Substance and Sexual Activity   Alcohol use: Yes    Comment: occassional   Drug use: Yes    Types: Other-see comments    Comment: cbd gummies   Sexual activity: Not Currently    Partners: Female  Other Topics Concern   Not on file  Social History Narrative   Not on file   Social Determinants of Health   Financial Resource Strain: Low Risk    Difficulty of Paying Living Expenses: Not hard at all  Food Insecurity: No Food Insecurity   Worried About Charity fundraiser in the Last Year: Never true   [place] in the Last Year: Never true  Transportation Needs: No Transportation Needs   Lack of Transportation (Medical): No   Lack of Transportation (Non-Medical): No  Physical Activity: Sufficiently Active   Days of Exercise per Week: 5 days   Minutes of Exercise per Session: 30 min  Stress: No Stress Concern Present   Feeling of Stress : Not at all  Social Connections: Moderately Integrated   Frequency of Communication with Friends and Family: Once a week   Frequency of Social Gatherings with Friends and Family: Never   Attends Religious Services: More than 4 times per year   Active Member of Clubs or Organizations: Yes  Attends Archivist Meetings: 1 to 4 times per year   Marital Status: Married    History reviewed. No pertinent family history.  Health Maintenance  Topic Date Due   Zoster Vaccines- Shingrix (1 of 2) Never done   COVID-19 Vaccine (1) 03/10/2021 (Originally 07/09/1951)   COLONOSCOPY (Pts 45-54yrs Insurance coverage will need to be confirmed)  11/05/2021   TETANUS/TDAP  01/13/2024   Hepatitis C Screening  Completed   HPV VACCINES  Aged Out   INFLUENZA VACCINE  Discontinued      ----------------------------------------------------------------------------------------------------------------------------------------------------------------------------------------------------------------- Physical Exam BP 138/73   Pulse 80   Ht 5\' 11"  (1.803 m)   Wt 200 lb (90.7 kg)   SpO2 100% Comment: on RA  BMI 27.89 kg/m   Physical Exam Constitutional:      General: He is not in acute distress. HENT:     Head: Normocephalic and atraumatic.     Right Ear: Tympanic membrane and external ear normal.     Left Ear: Tympanic membrane and external ear normal.  Eyes:     General: No scleral icterus. Neck:     Thyroid: No thyromegaly.  Cardiovascular:     Rate and Rhythm: Normal rate and regular rhythm.     Heart sounds: Normal heart sounds.  Pulmonary:     Effort: Pulmonary effort is normal.     Breath sounds: Normal breath sounds.  Abdominal:     General: Bowel sounds are normal. There is no distension.     Palpations: Abdomen is soft.     Tenderness: There is no abdominal tenderness. There is no guarding.  Musculoskeletal:     Cervical back: Normal range of motion.  Lymphadenopathy:     Cervical: No cervical adenopathy.  Skin:    General: Skin is warm and dry.     Findings: No rash.  Neurological:     Mental Status: He is alert and oriented to person, place, and time.     Cranial Nerves: No cranial nerve deficit.     Motor: No abnormal muscle tone.  Psychiatric:        Mood and Affect: Mood normal.        Behavior: Behavior normal.    ------------------------------------------------------------------------------------------------------------------------------------------------------------------------------------------------------------------- Assessment and Plan  Hyperlipidemia He is aware that cholesterol is quite elevated.  We discussed his ASCVD risk of approximately 27%.  Recommended starting statin however he declines at this time.  He will work on  diet and lifestyle change over the next several months.  Epilepsy ([place]) Tegretol renewed.  IMPAIRED FASTING GLUCOSE A1c stable.  Well adult exam Well adult Immunizations: Flu and shingles vaccines are recommended however he declines. Screenings: Up-to-date Anticipatory guidance/risk factor reduction: Recommendations per AVS.   Meds ordered this encounter  Medications   carbamazepine (TEGRETOL) 200 MG tablet    Sig: Take 1 tablet (200 mg total) by mouth daily.    Dispense:  90 tablet    Refill:  3   famotidine (PEPCID) 20 MG tablet    Sig: Take 1 tablet (20 mg total) by mouth 2 (two) times daily.    Dispense:  180 tablet    Refill:  3    Return in about 1 year (around 03/04/2022) for Annual exam.    This visit occurred during the SARS-CoV-2 public health emergency.  Safety protocols were in place, including screening questions prior to the visit, additional usage of staff PPE, and extensive cleaning of exam room while observing appropriate contact time as indicated for disinfecting solutions.

## 2021-03-04 NOTE — Assessment & Plan Note (Signed)
A1c stable

## 2021-03-04 NOTE — Patient Instructions (Addendum)
Preventive Care 23 Years and Older, Male Preventive care refers to lifestyle choices and visits with your health care provider that can promote health and wellness. This includes: A yearly physical exam. This is also called an annual wellness visit. Regular dental and eye exams. Immunizations. Screening for certain conditions. Healthy lifestyle choices, such as: Eating a healthy diet. Getting regular exercise. Not using drugs or products that contain nicotine and tobacco. Limiting alcohol use. What can I expect for my preventive care visit? Physical exam Your health care provider will check your: Height and weight. These may be used to calculate your BMI (body mass index). BMI is a measurement that tells if you are at a healthy weight. Heart rate and blood pressure. Body temperature. Skin for abnormal spots. Counseling Your health care provider may ask you questions about your: Past medical problems. Family's medical history. Alcohol, tobacco, and drug use. Emotional well-being. Home life and relationship well-being. Sexual activity. Diet, exercise, and sleep habits. History of falls. Memory and ability to understand (cognition). Work and work Statistician. Access to firearms. What immunizations do I need? Vaccines are usually given at various ages, according to a schedule. Your health care provider will recommend vaccines for you based on your age, medical history, and lifestyle or other factors, such as travel or where you work. What tests do I need? Blood tests Lipid and cholesterol levels. These may be checked every 5 years, or more often depending on your overall health. Hepatitis C test. Hepatitis B test. Screening Lung cancer screening. You may have this screening every year starting at age 29 if you have a 30-pack-year history of smoking and currently smoke or have quit within the past 15 years. Colorectal cancer screening. All adults should have this screening  starting at age 35 and continuing until age 9. Your health care provider may recommend screening at age 77 if you are at increased risk. You will have tests every 1-10 years, depending on your results and the type of screening test. Prostate cancer screening. Recommendations will vary depending on your family history and other risks. Genital exam to check for testicular cancer or hernias. Diabetes screening. This is done by checking your blood sugar (glucose) after you have not eaten for a while (fasting). You may have this done every 1-3 years. Abdominal aortic aneurysm (AAA) screening. You may need this if you are a current or former smoker. STD (sexually transmitted disease) testing, if you are at risk. Follow these instructions at home: Eating and drinking  Eat a diet that includes fresh fruits and vegetables, whole grains, lean protein, and low-fat dairy products. Limit your intake of foods with high amounts of sugar, saturated fats, and salt. Take vitamin and mineral supplements as recommended by your health care provider. Do not drink alcohol if your health care provider tells you not to drink. If you drink alcohol: Limit how much you have to 0-2 drinks a day. Be aware of how much alcohol is in your drink. In the [place] one drink equals one 12 oz bottle of beer (355 mL), one 5 oz glass of wine (148 mL), or one 1 oz glass of hard liquor (44 mL). Lifestyle Take daily care of your teeth and gums. Brush your teeth every morning and night with fluoride toothpaste. Floss one time each day. Stay active. Exercise for at least 30 minutes 5 or more days each week. Do not use any products that contain nicotine or tobacco, such as cigarettes, e-cigarettes, and chewing tobacco. If  you need help quitting, ask your health care provider. Do not use drugs. If you are sexually active, practice safe sex. Use a condom or other form of protection to prevent STIs (sexually transmitted infections). Talk  with your health care provider about taking a low-dose aspirin or statin. Find healthy ways to cope with stress, such as: Meditation, yoga, or listening to music. Journaling. Talking to a trusted person. Spending time with friends and family. Safety Always wear your seat belt while driving or riding in a vehicle. Do not drive: If you have been drinking alcohol. Do not ride with someone who has been drinking. When you are tired or distracted. While texting. Wear a helmet and other protective equipment during sports activities. If you have firearms in your house, make sure you follow all gun safety procedures. What's next? Visit your health care provider once a year for an annual wellness visit. Ask your health care provider how often you should have your eyes and teeth checked. Stay up to date on all vaccines. This information is not intended to replace advice given to you by your health care provider. Make sure you discuss any questions you have with your health care provider. Document Revised: 08/09/2020 Document Reviewed: 05/26/2018 Elsevier Patient Education  2022 [employer].

## 2021-03-04 NOTE — Assessment & Plan Note (Signed)
He is aware that cholesterol is quite elevated.  We discussed his ASCVD risk of approximately 27%.  Recommended starting statin however he declines at this time.  He will work on diet and lifestyle change over the next several months.

## 2021-03-11 ENCOUNTER — Ambulatory Visit (INDEPENDENT_AMBULATORY_CARE_PROVIDER_SITE_OTHER): Payer: Medicare Other | Admitting: Sports Medicine

## 2021-03-11 DIAGNOSIS — D1724 Benign lipomatous neoplasm of skin and subcutaneous tissue of left leg: Secondary | ICD-10-CM

## 2021-03-11 NOTE — Progress Notes (Signed)
    Procedures performed today:    Procedure:  Excision of 3.2 cm left thigh subcutaneous lipomatous tumor Risks, benefits, and alternatives explained and consent obtained. Time out conducted. Surface prepped with alcohol. 10cc lidocaine with epinephine infiltrated in a field block. Adequate anesthesia ensured. Area prepped and draped in a sterile fashion. Excision performed with: Using a #10 blade I made a linear incision, then using blunt dissection I was able to expose the subcutaneous tumor that appeared to be a lipoma, using blunt dissection it was removed en bloc, I then closed the incision with 2 deep 3-0 Vicryl sutures, I then did a running subcuticular Vicryl stitch to close the incision. Hemostasis achieved. Pt stable.  Independent interpretation of notes and tests performed by another provider:   None.  Brief History, Exam, Impression, and Recommendations:    Lipoma left thigh This is a pleasant 70 year old male, he has history of subcutaneous lipomas, he has had multiple removed in the past, he has 1 on the left inner thigh that bothers him, today we did a surgical excision, return to see me in 2 weeks for wound check.    ___________________________________________ Gwen Her. Dianah Field, M.D., ABFM., CAQSM. Primary Care and [place] Instructor of [employer] [facility]

## 2021-03-11 NOTE — Patient Instructions (Signed)
Incision Care, Adult ?An incision is a surgical cut that is made through your skin. Most incisions are closed after a surgical procedure. Your incision may be closed with stitches (sutures), staples, skin glue, or adhesive strips. You may need to return to your health care provider to have sutures or staples removed. This may occur several days or several weeks after your surgery. Until then, the incision needs to be cared for properly to prevent infection. Follow instructions from your health care provider about how to care for your incision. ?Supplies needed: ?Soap, water, and a clean hand towel. ?Wound cleanser. ?A clean bandage (dressing), if needed. ?Cream or ointment, if told by your health care provider. ?Clean gauze. ?How to care for your incision ?Cleaning the incision ?Ask your health care provider how to clean the incision. This may include: ?Wearing medical gloves. ?Using mild soap and water, or wound cleanser. ?Using a clean gauze to pat the incision dry after cleaning it. ?Dressing changes ?Wash your hands with soap and water for at least 20 seconds before and after you change the dressing. If soap and water are not available, use hand sanitizer. ?Do not use disinfectants or antiseptics, such as rubbing alcohol, to clean open wounds unless told by your health care provider. ?Change your dressing as told by your health care provider. ?Leave sutures, staples, skin glue, or adhesive strips in place. These skin closures may need to stay in place for 2 weeks or longer. If adhesive strip edges start to loosen and curl up, you may trim the loose edges. Do not remove adhesive strips completely unless your health care provider tells you to do that. ?Apply cream or ointment. Do this only as told by your health care provider. ?Cover the incision with a clean dressing. Ask your health care provider when you can start to leave the incision uncovered. ?Checking for infection ?Check your incision area every day for  signs of infection. Check for: ?More redness, swelling, or pain. ?More fluid or blood. ?New warmth, hardness, or a rash that develops along the incision. ?Pus or a bad smell. ? ?Follow these instructions at home ?Medicines ?Take over-the-counter and prescription medicines only as told by your health care provider. ?If you were prescribed an antibiotic medicine, cream, or ointment, take or apply it as told by your health care provider. Do not stop using the antibiotic even if your condition improves. ?Eating and drinking ?Eat a diet that includes protein, vitamin A, vitamin C, and other nutrient-rich foods to help the wound heal. ?Foods rich in protein include meat, fish, eggs, dairy, beans, nuts, and protein supplement drinks. ?Foods rich in vitamin A include carrots and dark green, leafy vegetables. ?Foods rich in vitamin C include citrus fruits, tomatoes, broccoli, and peppers. ?Drink enough fluid to keep your urine pale yellow. ?General instructions ? ?Do not take baths, swim, use a hot tub, or do anything that would put the incision underwater until your health care provider approves. Ask your health care provider if you may take showers. You may only be allowed to take sponge baths. ?Limit movement around your incision to promote healing. ?Avoid straining, lifting, or exercising for the first 2 weeks after your procedure, or for as long as told by your health care provider. ?Return to your normal activities as told by your health care provider. Ask your health care provider what activities are safe for you. ?Do not scratch, pick, or scrub the incision. Keep it covered as told by your health care provider. ?  Protect your incision from the sun when you are outside for the first 6 months, or for as long as told by your health care provider. Cover up the scar area or apply sunscreen that has an SPF of at least 30. ?Do not use any products that contain nicotine or tobacco, such as cigarettes, e-cigarettes, and  chewing tobacco. These can delay incision healing. If you need help quitting, ask your health care provider. ?Keep all follow-up visits. This is important. ?Contact a health care provider if: ?You have any of these signs of infection: ?More redness, swelling, or pain around your incision. ?More fluid or blood coming from your incision. ?New warmth or hardness around your incision. ?Pus or a bad smell coming from your incision. ?A rash that develops along the incision. ?You feel nauseous or you vomit. ?You are dizzy. ?Your sutures, staples, skin glue, or adhesive strips come undone. ?Your wound gets bigger. ?You have a fever. ?Get help right away if: ?Your incision bleeds through the dressing and the bleeding does not stop with gentle pressure. ?The edges of your incision open up and separate. ?These symptoms may represent a serious problem that is an emergency. Do not wait to see if the symptoms will go away. Get medical help right away. Call your local emergency services (911 in the [place] Do not drive yourself to the hospital. ?Summary ?Follow instructions from your health care provider about how to care for your incision. ?Wash your hands with soap and water for at least 20 seconds before and after you change the dressing. If soap and water are not available, use hand sanitizer. ?Check your incision area every day for signs of infection. ?Keep all follow-up visits. This is important. ?This information is not intended to replace advice given to you by your health care provider. Make sure you discuss any questions you have with your health care provider. ?Document Revised: 09/02/2020 Document Reviewed: 09/02/2020 ?Elsevier Patient Education ? 2022 Elsevier Inc. ? ?

## 2021-03-11 NOTE — Assessment & Plan Note (Signed)
This is a pleasant 70 year old male, he has history of subcutaneous lipomas, he has had multiple removed in the past, he has 1 on the left inner thigh that bothers him, today we did a surgical excision, return to see me in 2 weeks for wound check.

## 2021-03-25 ENCOUNTER — Ambulatory Visit (INDEPENDENT_AMBULATORY_CARE_PROVIDER_SITE_OTHER): Payer: Medicare Other | Admitting: Sports Medicine

## 2021-03-25 DIAGNOSIS — D1724 Benign lipomatous neoplasm of skin and subcutaneous tissue of left leg: Secondary | ICD-10-CM

## 2021-03-25 NOTE — Progress Notes (Signed)
    Procedures performed today:    None.  Independent interpretation of notes and tests performed by another provider:   None.  Brief History, Exam, Impression, and Recommendations:    Lipoma left thigh 2 weeks postop, lipoma excision left upper inner thigh, incision is clean, dry, intact, encouraged to massage the surgical incision to soften the scar, return as needed.    ___________________________________________ Gwen Her. Dianah Field, M.D., ABFM., CAQSM. Primary Care and [place] Instructor of [place] [employer] [facility]

## 2021-03-25 NOTE — Assessment & Plan Note (Signed)
2 weeks postop, lipoma excision left upper inner thigh, incision is clean, dry, intact, encouraged to massage the surgical incision to soften the scar, return as needed.

## 2021-05-29 DIAGNOSIS — L821 Other seborrheic keratosis: Secondary | ICD-10-CM | POA: Diagnosis not present

## 2021-05-29 DIAGNOSIS — L719 Rosacea, unspecified: Secondary | ICD-10-CM | POA: Diagnosis not present

## 2021-05-29 DIAGNOSIS — L814 Other melanin hyperpigmentation: Secondary | ICD-10-CM | POA: Diagnosis not present

## 2022-03-02 ENCOUNTER — Other Ambulatory Visit: Payer: Self-pay

## 2022-03-02 DIAGNOSIS — Z79899 Other long term (current) drug therapy: Secondary | ICD-10-CM

## 2022-03-02 DIAGNOSIS — N2581 Secondary hyperparathyroidism of renal origin: Secondary | ICD-10-CM

## 2022-03-02 DIAGNOSIS — R7301 Impaired fasting glucose: Secondary | ICD-10-CM

## 2022-03-02 DIAGNOSIS — Z0000 Encounter for general adult medical examination without abnormal findings: Secondary | ICD-10-CM

## 2022-03-02 DIAGNOSIS — E7800 Pure hypercholesterolemia, unspecified: Secondary | ICD-10-CM

## 2022-03-02 NOTE — Progress Notes (Unsigned)
Patient lvm requesting labs prior to appt. Lab orders placed.   Please call patient to schedule.

## 2022-03-05 ENCOUNTER — Encounter: Payer: Self-pay | Admitting: Family Medicine

## 2022-03-05 ENCOUNTER — Ambulatory Visit (INDEPENDENT_AMBULATORY_CARE_PROVIDER_SITE_OTHER): Payer: Medicare Other | Admitting: Family Medicine

## 2022-03-05 VITALS — BP 131/76 | HR 67 | Ht 71.0 in | Wt 193.0 lb

## 2022-03-05 DIAGNOSIS — Z1211 Encounter for screening for malignant neoplasm of colon: Secondary | ICD-10-CM

## 2022-03-05 DIAGNOSIS — G40909 Epilepsy, unspecified, not intractable, without status epilepticus: Secondary | ICD-10-CM

## 2022-03-05 DIAGNOSIS — Z0000 Encounter for general adult medical examination without abnormal findings: Secondary | ICD-10-CM | POA: Diagnosis not present

## 2022-03-05 DIAGNOSIS — R7301 Impaired fasting glucose: Secondary | ICD-10-CM | POA: Diagnosis not present

## 2022-03-05 DIAGNOSIS — N2581 Secondary hyperparathyroidism of renal origin: Secondary | ICD-10-CM | POA: Diagnosis not present

## 2022-03-05 DIAGNOSIS — Z125 Encounter for screening for malignant neoplasm of prostate: Secondary | ICD-10-CM | POA: Diagnosis not present

## 2022-03-05 MED ORDER — CARBAMAZEPINE 200 MG PO TABS: 200.0000 mg | ORAL_TABLET | Freq: Every day | ORAL | 3 refills | Status: DC

## 2022-03-05 MED ORDER — FAMOTIDINE 20 MG PO TABS: 20.0000 mg | ORAL_TABLET | Freq: Two times a day (BID) | ORAL | 3 refills | Status: DC

## 2022-03-05 MED ORDER — METRONIDAZOLE 0.75 % EX GEL: Freq: Two times a day (BID) | CUTANEOUS | 3 refills | Status: AC

## 2022-03-05 NOTE — Patient Instructions (Signed)
Preventive Care 65 Years and Older, Male Preventive care refers to lifestyle choices and visits with your health care provider that can promote health and wellness. Preventive care visits are also called wellness exams. What can I expect for my preventive care visit? Counseling During your preventive care visit, your health care provider may ask about your: Medical history, including: Past medical problems. Family medical history. History of falls. Current health, including: Emotional well-being. Home life and relationship well-being. Sexual activity. Memory and ability to understand (cognition). Lifestyle, including: Alcohol, nicotine or tobacco, and drug use. Access to firearms. Diet, exercise, and sleep habits. Work and work environment. Sunscreen use. Safety issues such as seatbelt and bike helmet use. Physical exam Your health care provider will check your: Height and weight. These may be used to calculate your BMI (body mass index). BMI is a measurement that tells if you are at a healthy weight. Waist circumference. This measures the distance around your waistline. This measurement also tells if you are at a healthy weight and may help predict your risk of certain diseases, such as type 2 diabetes and high blood pressure. Heart rate and blood pressure. Body temperature. Skin for abnormal spots. What immunizations do I need?  Vaccines are usually given at various ages, according to a schedule. Your health care provider will recommend vaccines for you based on your age, medical history, and lifestyle or other factors, such as travel or where you work. What tests do I need? Screening Your health care provider may recommend screening tests for certain conditions. This may include: Lipid and cholesterol levels. Diabetes screening. This is done by checking your blood sugar (glucose) after you have not eaten for a while (fasting). Hepatitis C test. Hepatitis B test. HIV (human  immunodeficiency virus) test. STI (sexually transmitted infection) testing, if you are at risk. Lung cancer screening. Colorectal cancer screening. Prostate cancer screening. Abdominal aortic aneurysm (AAA) screening. You may need this if you are a current or former smoker. Talk with your health care provider about your test results, treatment options, and if necessary, the need for more tests. Follow these instructions at home: Eating and drinking  Eat a diet that includes fresh fruits and vegetables, whole grains, lean protein, and low-fat dairy products. Limit your intake of foods with high amounts of sugar, saturated fats, and salt. Take vitamin and mineral supplements as recommended by your health care provider. Do not drink alcohol if your health care provider tells you not to drink. If you drink alcohol: Limit how much you have to 0-2 drinks a day. Know how much alcohol is in your drink. In the [place] one drink equals one 12 oz bottle of beer (355 mL), one 5 oz glass of wine (148 mL), or one 1 oz glass of hard liquor (44 mL). Lifestyle Brush your teeth every morning and night with fluoride toothpaste. Floss one time each day. Exercise for at least 30 minutes 5 or more days each week. Do not use any products that contain nicotine or tobacco. These products include cigarettes, chewing tobacco, and vaping devices, such as e-cigarettes. If you need help quitting, ask your health care provider. Do not use drugs. If you are sexually active, practice safe sex. Use a condom or other form of protection to prevent STIs. Take aspirin only as told by your health care provider. Make sure that you understand how much to take and what form to take. Work with your health care provider to find out whether it is safe   and beneficial for you to take aspirin daily. Ask your health care provider if you need to take a cholesterol-lowering medicine (statin). Find healthy ways to manage stress, such  as: Meditation, yoga, or listening to music. Journaling. Talking to a trusted person. Spending time with friends and family. Safety Always wear your seat belt while driving or riding in a vehicle. Do not drive: If you have been drinking alcohol. Do not ride with someone who has been drinking. When you are tired or distracted. While texting. If you have been using any mind-altering substances or drugs. Wear a helmet and other protective equipment during sports activities. If you have firearms in your house, make sure you follow all gun safety procedures. Minimize exposure to UV radiation to reduce your risk of skin cancer. What's next? Visit your health care provider once a year for an annual wellness visit. Ask your health care provider how often you should have your eyes and teeth checked. Stay up to date on all vaccines. This information is not intended to replace advice given to you by your health care provider. Make sure you discuss any questions you have with your health care provider. Document Revised: 11/27/2020 Document Reviewed: 11/27/2020 Elsevier Patient Education  2023 Elsevier Inc.  

## 2022-03-05 NOTE — Progress Notes (Signed)
Patient seen and labs drawn. Robert Hodges

## 2022-03-05 NOTE — Progress Notes (Signed)
Robert Hodges - 71 y.o. male MRN [id number]  Date of birth: 03-30-1951  Subjective Chief Complaint  Patient presents with   Annual Exam    HPI Robert Hodges is a 71 year old male here today for annual exam.  He reports that he is doing well at this time.  No new concerns today.  Labs completed prior to visit today however have not returned yet.  Chronic conditions remain well controlled with current medications.  Denies any recent seizures.  He continues to stay quite active.  Feels like diet is pretty good.  He is a non-smoker.  Consumes alcohol occasionally.  Declines flu vaccine at this time.  Review of Systems  Constitutional:  Negative for chills, fever, malaise/fatigue and weight loss.  HENT:  Negative for congestion, ear pain and sore throat.   Eyes:  Negative for blurred vision, double vision and pain.  Respiratory:  Negative for cough and shortness of breath.   Cardiovascular:  Negative for chest pain and palpitations.  Gastrointestinal:  Negative for abdominal pain, blood in stool, constipation, heartburn and nausea.  Genitourinary:  Negative for dysuria and urgency.  Musculoskeletal:  Negative for joint pain and myalgias.  Neurological:  Negative for dizziness and headaches.  Endo/Heme/Allergies:  Does not bruise/bleed easily.  Psychiatric/Behavioral:  Negative for depression. The patient is not nervous/anxious and does not have insomnia.     No Known Allergies  Past Medical History:  Diagnosis Date   Seizure disorder [facility])     Past Surgical History:  Procedure Laterality Date   pudendal nerve  pudendal nerve    surgery    Social History   Socioeconomic History   Marital status: Married    Spouse name: Not on file   Number of children: Not on file   Years of education: Not on file   Highest education level: Not on file  Occupational History   Not on file  Tobacco Use   Smoking status: Former   Smokeless tobacco: Never  Substance and Sexual Activity    Alcohol use: Yes    Comment: occassional   Drug use: Yes    Types: Other-see comments    Comment: cbd gummies   Sexual activity: Not Currently    Partners: Female  Other Topics Concern   Not on file  Social History Narrative   Not on file   Social Determinants of Health   Financial Resource Strain: Low Risk  (02/22/2021)   Overall Financial Resource Strain (CARDIA)    Difficulty of Paying Living Expenses: Not hard at all  Food Insecurity: No Food Insecurity (02/22/2021)   Hunger Vital Sign    Worried About Running Out of Food in the Last Year: Never true    Ran Out of Food in the Last Year: Never true  Transportation Needs: No Transportation Needs (02/22/2021)   PRAPARE - Hydrologist (Medical): No    Lack of Transportation (Non-Medical): No  Physical Activity: Sufficiently Active (02/22/2021)   Exercise Vital Sign    Days of Exercise per Week: 5 days    Minutes of Exercise per Session: 30 min  Stress: No Stress Concern Present (02/22/2021)   [place]    Feeling of Stress : Not at all  Social Connections: Moderately Integrated (02/22/2021)   Social Connection and Isolation Panel [NHANES]    Frequency of Communication with Friends and Family: Once a week    Frequency of Social Gatherings with Friends and  Family: Never    Attends Religious Services: More than 4 times per year    Active Member of Clubs or Organizations: Yes    Attends Archivist Meetings: 1 to 4 times per year    Marital Status: Married    History reviewed. No pertinent family history.  Health Maintenance  Topic Date Due   COVID-19 Vaccine (1) 07/16/2022 (Originally 01/06/1956)   Zoster Vaccines- Shingrix (1 of 2) 07/16/2022 (Originally [birth year]/07/1968)   Pneumonia Vaccine 45+ Years old (1 - PCV) 09/06/2022 (Originally 01/06/2014)   COLONOSCOPY (Pts 45-39yr Insurance coverage will need to be confirmed)   03/06/2023 (Originally 11/05/2021)   TETANUS/TDAP  01/13/2024   Hepatitis C Screening  Completed   HPV VACCINES  Aged Out   INFLUENZA VACCINE  Discontinued     ----------------------------------------------------------------------------------------------------------------------------------------------------------------------------------------------------------------- Physical Exam BP 131/76 (BP Location: Right Arm, Patient Position: Sitting, Cuff Size: Normal)   Pulse 67   Ht '5\' 11"'$  (1.803 m)   Wt 193 lb (87.5 kg)   SpO2 97%   BMI 26.92 kg/m   Physical Exam Constitutional:      General: He is not in acute distress. HENT:     Head: Normocephalic and atraumatic.     Right Ear: Tympanic membrane and external ear normal.     Left Ear: Tympanic membrane and external ear normal.  Eyes:     General: No scleral icterus. Neck:     Thyroid: No thyromegaly.  Cardiovascular:     Rate and Rhythm: Normal rate and regular rhythm.     Heart sounds: Normal heart sounds.  Pulmonary:     Effort: Pulmonary effort is normal.     Breath sounds: Normal breath sounds.  Abdominal:     General: Bowel sounds are normal. There is no distension.     Palpations: Abdomen is soft.     Tenderness: There is no abdominal tenderness. There is no guarding.  Musculoskeletal:     Cervical back: Normal range of motion.  Lymphadenopathy:     Cervical: No cervical adenopathy.  Skin:    General: Skin is warm and dry.     Findings: No rash.  Neurological:     Mental Status: He is alert and oriented to person, place, and time.     Cranial Nerves: No cranial nerve deficit.     Motor: No abnormal muscle tone.  Psychiatric:        Mood and Affect: Mood normal.        Behavior: Behavior normal.      ------------------------------------------------------------------------------------------------------------------------------------------------------------------------------------------------------------------- Assessment and Plan  Well adult exam Well adult Labs drawn today Screenings: Per lab orders.  Due for updated colon cancer screening, orders entered Immunizations: Declines recommended immunizations Anticipatory guidance/risk factor reduction: Recommendations per AVS.   Meds ordered this encounter  Medications   metroNIDAZOLE (METROGEL) 0.75 % gel    Sig: Apply topically 2 (two) times daily.    Dispense:  45 g    Refill:  3   famotidine (PEPCID) 20 MG tablet    Sig: Take 1 tablet (20 mg total) by mouth 2 (two) times daily.    Dispense:  180 tablet    Refill:  3   carbamazepine (TEGRETOL) 200 MG tablet    Sig: Take 1 tablet (200 mg total) by mouth daily.    Dispense:  90 tablet    Refill:  3    No follow-ups on file.    This visit occurred during the SARS-CoV-2 public health emergency.  Safety protocols  were in place, including screening questions prior to the visit, additional usage of staff PPE, and extensive cleaning of exam room while observing appropriate contact time as indicated for disinfecting solutions.

## 2022-03-05 NOTE — Assessment & Plan Note (Signed)
Well adult Labs drawn today Screenings: Per lab orders.  Due for updated colon cancer screening, orders entered Immunizations: Declines recommended immunizations Anticipatory guidance/risk factor reduction: Recommendations per AVS.

## 2022-03-06 LAB — CBC WITH DIFFERENTIAL/PLATELET
Absolute Monocytes: 410 cells/uL (ref 200–950)
Basophils Absolute: 19 cells/uL (ref 0–200)
Basophils Relative: 0.3 %
Eosinophils Absolute: 170 cells/uL (ref 15–500)
Eosinophils Relative: 2.7 %
HCT: 42.3 % (ref 38.5–50.0)
Hemoglobin: 14.2 g/dL (ref 13.2–17.1)
Lymphs Abs: 2060 cells/uL (ref 850–3900)
MCH: 31.6 pg (ref 27.0–33.0)
MCHC: 33.6 g/dL (ref 32.0–36.0)
MCV: 94 fL (ref 80.0–100.0)
MPV: 10.1 fL (ref 7.5–12.5)
Monocytes Relative: 6.5 %
Neutro Abs: 3641 cells/uL (ref 1500–7800)
Neutrophils Relative %: 57.8 %
Platelets: 237 10*3/uL (ref 140–400)
RBC: 4.5 10*6/uL (ref 4.20–5.80)
RDW: 12.2 % (ref 11.0–15.0)
Total Lymphocyte: 32.7 %
WBC: 6.3 10*3/uL (ref 3.8–10.8)

## 2022-03-06 LAB — CARBAMAZEPINE LEVEL, TOTAL: Carbamazepine Lvl: 4.9 mg/L (ref 4.0–12.0)

## 2022-03-06 LAB — LIPID PANEL W/REFLEX DIRECT LDL
Cholesterol: 278 mg/dL — ABNORMAL HIGH (ref <200)
HDL: 43 mg/dL (ref >=40)
LDL Cholesterol (Calc): 201 mg/dL (calc) — ABNORMAL HIGH
Non-HDL Cholesterol (Calc): 235 mg/dL (calc) — ABNORMAL HIGH (ref <130)
Total CHOL/HDL Ratio: 6.5 (calc) — ABNORMAL HIGH (ref <5.0)
Triglycerides: 172 mg/dL — ABNORMAL HIGH (ref <150)

## 2022-03-06 LAB — COMPLETE METABOLIC PANEL WITH GFR
AG Ratio: 1.7 (calc) (ref 1.0–2.5)
ALT: 19 U/L (ref 9–46)
AST: 16 U/L (ref 10–35)
Albumin: 4.4 g/dL (ref 3.6–5.1)
Alkaline phosphatase (APISO): 72 U/L (ref 35–144)
BUN: 15 mg/dL (ref 7–25)
CO2: 29 mmol/L (ref 20–32)
Calcium: 9.3 mg/dL (ref 8.6–10.3)
Chloride: 106 mmol/L (ref 98–110)
Creat: 0.96 mg/dL (ref 0.70–1.28)
Globulin: 2.6 g/dL (calc) (ref 1.9–3.7)
Glucose, Bld: 138 mg/dL — ABNORMAL HIGH (ref 65–99)
Potassium: 4.3 mmol/L (ref 3.5–5.3)
Sodium: 141 mmol/L (ref 135–146)
Total Bilirubin: 0.3 mg/dL (ref 0.2–1.2)
Total Protein: 7 g/dL (ref 6.1–8.1)
eGFR: 83 mL/min/{1.73_m2} (ref >=60)

## 2022-03-06 LAB — HEMOGLOBIN A1C
Hgb A1c MFr Bld: 6.4 % of total Hgb — ABNORMAL HIGH (ref <5.7)
Mean Plasma Glucose: 137 mg/dL
eAG (mmol/L): 7.6 mmol/L

## 2022-03-06 LAB — PSA: PSA: 1.58 ng/mL (ref <=4.00)

## 2022-05-29 ENCOUNTER — Ambulatory Visit
Admission: EM | Admit: 2022-05-29 | Discharge: 2022-05-29 | Disposition: A | Discharge status: Home / Self Care | Payer: Medicare Other | Attending: Family Medicine | Admitting: Family Medicine

## 2022-05-29 ENCOUNTER — Telehealth: Payer: Self-pay

## 2022-05-29 ENCOUNTER — Ambulatory Visit (INDEPENDENT_AMBULATORY_CARE_PROVIDER_SITE_OTHER): Payer: Medicare Other

## 2022-05-29 ENCOUNTER — Encounter: Payer: Self-pay | Admitting: Emergency Medicine

## 2022-05-29 DIAGNOSIS — R059 Cough, unspecified: Secondary | ICD-10-CM | POA: Diagnosis not present

## 2022-05-29 DIAGNOSIS — M5440 Lumbago with sciatica, unspecified side: Secondary | ICD-10-CM

## 2022-05-29 DIAGNOSIS — M545 Low back pain, unspecified: Secondary | ICD-10-CM

## 2022-05-29 DIAGNOSIS — M5410 Radiculopathy, site unspecified: Secondary | ICD-10-CM | POA: Diagnosis not present

## 2022-05-29 DIAGNOSIS — J189 Pneumonia, unspecified organism: Secondary | ICD-10-CM | POA: Diagnosis not present

## 2022-05-29 MED ORDER — AMOXICILLIN-POT CLAVULANATE 875-125 MG PO TABS: 1.0000 | ORAL_TABLET | Freq: Two times a day (BID) | ORAL | 0 refills | Status: AC

## 2022-05-29 MED ORDER — PREDNISONE 10 MG (21) PO TBPK: ORAL_TABLET | Freq: Every day | ORAL | 0 refills | Status: DC

## 2022-05-29 MED ORDER — BENZONATATE 200 MG PO CAPS: 200.0000 mg | ORAL_CAPSULE | Freq: Three times a day (TID) | ORAL | 0 refills | Status: AC | PRN

## 2022-05-29 MED ORDER — PROMETHAZINE-DM 6.25-15 MG/5ML PO SYRP: 5.0000 mL | ORAL_SOLUTION | Freq: Two times a day (BID) | ORAL | 0 refills | Status: DC | PRN

## 2022-05-29 MED ORDER — AZITHROMYCIN 250 MG PO TABS: 250.0000 mg | ORAL_TABLET | Freq: Every day | ORAL | 0 refills | Status: DC

## 2022-05-29 NOTE — Telephone Encounter (Signed)
Pt's spouse lvm stating patient was experiencing sciatic pain that is being aggravated by a possible RSV infection with cough.   Please call to schedule asap. Thanks

## 2022-05-29 NOTE — ED Provider Notes (Signed)
[facility]    CSN: [id number] Arrival date & time: 05/29/22  0935      History   Chief Complaint Chief Complaint  Patient presents with   Back Pain    Left side    HPI [name] is a 71 y.o. male.   HPI Pleasant 71 year old male presents with left-sided back pain for 2 months has been working with chiropractor over the last several weeks with little to no relief and is reporting cough related to recent RSV infection.  PMH significant for seizure disorder, epilepsy, and food Introl neuralgia.  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 Authorizing Provider  amoxicillin-clavulanate (AUGMENTIN) 875-125 MG tablet Take 1 tablet by mouth every 12 (twelve) hours for 10 days. 05/29/22 06/08/22 Yes Eliezer Lofts, FNP  azithromycin (ZITHROMAX) 250 MG tablet Take 1 tablet (250 mg total) by mouth daily. Take first 2 tablets together, then 1 every day until finished. 05/29/22  Yes Eliezer Lofts, FNP  benzonatate (TESSALON) 200 MG capsule Take 1 capsule (200 mg total) by mouth 3 (three) times daily as needed for up to 7 days. 05/29/22 06/05/22 Yes Eliezer Lofts, FNP  predniSONE (STERAPRED UNI-PAK 21 TAB) 10 MG (21) TBPK tablet Take by mouth daily. Take 6 tabs by mouth daily  for  2 days, then 5 tabs for 2 days, then 4 tabs for 2 days, then 3 tabs for 2 days, 2 tabs for 2 days, then 1 tab by mouth daily for 2 days 05/29/22  Yes Eliezer Lofts, FNP  promethazine-dextromethorphan (PROMETHAZINE-DM) 6.25-15 MG/5ML syrup Take 5 mLs by mouth 2 (two) times daily as needed for cough. 05/29/22  Yes Eliezer Lofts, FNP  carbamazepine (TEGRETOL) 200 MG tablet Take 1 tablet (200 mg total) by mouth daily. 03/05/22   Luetta Nutting, DO  famotidine (PEPCID) 20 MG tablet Take 1 tablet (20 mg total) by mouth 2 (two) times daily. 03/05/22   Luetta Nutting, DO  metroNIDAZOLE (METROGEL) 0.75 % gel Apply topically 2 (two) times daily. 03/05/22   Luetta Nutting, DO    Family History Family History  Problem Relation Age of Onset   Heart failure Mother     Social History Social History   Tobacco Use   Smoking status: Former   Smokeless tobacco: Never  Scientific laboratory technician Use: Never used  Substance Use Topics   Alcohol use: Yes    Comment: occassional   Drug use: Yes    Types: Other-see comments    Comment: cbd gummies     Allergies   Patient has no known allergies.   Review of Systems Review of Systems  Constitutional:  Positive for fever.  Respiratory:  Positive for cough.   Musculoskeletal:  Positive for back pain.  All other systems reviewed and are negative.    Physical Exam Triage Vital Signs ED Triage Vitals  Enc Vitals Group     BP 05/29/22 1000 133/82     Pulse Rate 05/29/22 1000 81     Resp 05/29/22 1000 16     Temp 05/29/22 1000 99.3 F (37.4 C)     Temp Source 05/29/22 1000 Oral     SpO2 05/29/22 1000 98 %     Weight 05/29/22 1002 185 lb (83.9 kg)     Height 05/29/22 1002 '5\' 11"'$  (1.803 m)     Head Circumference --      Peak Flow --      Pain Score 05/29/22 1001 4     Pain Loc --      Pain Edu? --      Excl. in [place]? --    No data found.  Updated Vital Signs BP 133/82 (BP Location: Left Arm)   Pulse 81   Temp 99.3 F (37.4 C) (Oral)   Resp 16    Ht '5\' 11"'$  (1.803 m)   Wt 185 lb (83.9 kg)   SpO2 98%   BMI 25.80 kg/m    Physical Exam Vitals and nursing note reviewed.  Constitutional:      Appearance: Normal appearance. He is normal weight. He is ill-appearing.  HENT:     Head: Normocephalic and atraumatic.     Right Ear: Tympanic membrane, ear canal and external ear normal.     Left Ear: Ear canal and external ear normal.     Nose: Nose normal.     Mouth/Throat:     Mouth: Mucous membranes are moist.     Pharynx: Oropharynx is clear.     Comments: Moderate amount of clear drainage of posterior oropharynx noted Eyes:     Extraocular Movements: Extraocular movements intact.     Conjunctiva/sclera: Conjunctivae normal.     Pupils: Pupils are equal, round, and reactive to light.  Cardiovascular:     Rate and Rhythm: Normal rate and regular rhythm.     Pulses: Normal pulses.     Heart sounds: Normal heart sounds. No murmur heard. Pulmonary:     Effort: Pulmonary effort is normal.     Breath sounds: Rales present. No wheezing or rhonchi.     Comments: Fine rales noted over left lower lung, infrequent nonproductive cough noted on exam;  Abdominal:     General: Bowel sounds are normal.  Musculoskeletal:        General: Normal range of motion.     Cervical back: Normal range of motion and neck supple.     Comments: Lumbar sacral spine (left-sided): TTP over spinous processes, paraspinous muscles, left inferior spinal erectors  Skin:    General: Skin is warm and dry.  Neurological:     General: No focal deficit present.     Mental Status: He is alert and oriented to person, place, and time. Mental status is at baseline.      UC Treatments / Results  Labs (all labs ordered are listed, but only abnormal results are displayed) Labs Reviewed - No data to display  EKG   Radiology DG Lumbar Spine Complete  Result Date: 05/29/2022 CLINICAL DATA:  Lower back pain.  Left radiculopathy. EXAM: LUMBAR SPINE - COMPLETE 4+  VIEW COMPARISON:  None Available. FINDINGS: There are 5 non-rib-bearing lumbar-type vertebral bodies. Normal frontal and sagittal alignment. Vertebral body heights are maintained. Moderate anterior L2-3 endplate osteophytes. Mild anterior  L1-2 and minimal anterior T12-L1 anterior endplate osteophytes. Mild posterior L5-S1 disc space narrowing. Mild L5-S1 facet joint arthropathy. IMPRESSION: Mild L5-S1 degenerative disc and facet disease. Electronically Signed   By: Yvonne Kendall M.D.   On: 05/29/2022 11:31   DG Chest 2 View  Result Date: 05/29/2022 CLINICAL DATA:  Cough for 2 weeks. EXAM: CHEST - 2 VIEW COMPARISON:  Chest two views 11/11/2007 FINDINGS: Cardiac silhouette and mediastinal contours are within normal limits. There is a new subtle heterogeneous opacity overlying the slightly lower aspect of the left lung on frontal view, possibly within the lower lobe on lateral view. The right lung is clear. No pleural effusion or pneumothorax. No acute skeletal abnormality. IMPRESSION: New subtle heterogeneous opacity overlying the slightly lower aspect of the left lung on frontal view, possibly within the lower lobe on lateral view. This could represent early pneumonia in the appropriate clinical setting. Electronically Signed   By: Yvonne Kendall M.D.   On: 05/29/2022 11:30    Procedures Procedures (including critical care time)  Medications Ordered in UC Medications - No data to display  Initial Impression / Assessment and Plan / UC Course  I have reviewed the triage vital signs and the nursing notes.  Pertinent labs & imaging results that were available during my care of the patient were reviewed by me and considered in my medical decision making (see chart for details).     MDM: 1.  Community acquired pneumonia of left lung, unspecified part of lung; 2.  Cough-CXR revealed above, Rx'd Tessalon, Promethazine DM; 3.  Low back pain with sciatica, sciatica laterally unspecified, unspecified back  pain-Rx'd Sterapred Unipak. Instructed patient to take medications as directed with food to completion.  Advised patient to take prednisone and Zithromax with first dose of Augmentin for the next 10 days.  Encouraged patient to increase daily water intake to 64 ounces per day while taking these medications.  Advised may take Tessalon Perles daily or as needed for cough.  Advised may take promethazine DM at night for cough prior to sleep due to sedate of effects.  Advised patient not to take cough medications together.  Advised if symptoms worsen and/or unresolved please follow-up with PCP or here for further evaluation.  Discharged home, hemodynamically stable. Final Clinical Impressions(s) / UC Diagnoses   Final diagnoses:  Low back pain with sciatica, sciatica laterality unspecified, unspecified back pain laterality, unspecified chronicity  Cough, unspecified type  Community acquired pneumonia of left lung, unspecified part of lung     Discharge Instructions      Instructed patient to take medications as directed with food to completion.  Advised patient to take prednisone and Zithromax with first dose of Augmentin for the next 10 days.  Encouraged patient to increase daily water intake to 64 ounces per day while taking these medications.  Advised may take Tessalon Perles daily or as needed for cough.  Advised may take promethazine DM at night for cough prior to sleep due to sedate of effects.  Advised patient not to take cough medications together.  Advised if symptoms worsen and/or unresolved please follow-up with PCP or here for further evaluation.     ED Prescriptions     Medication Sig Dispense Auth. Provider   predniSONE (STERAPRED UNI-PAK 21 TAB) 10 MG (21) TBPK tablet Take by mouth daily. Take 6 tabs by mouth daily  for 2 days, then 5 tabs for 2 days, then 4 tabs for 2 days, then 3 tabs for 2 days,  2 tabs for 2 days, then 1 tab by mouth daily for 2 days 42 tablet Eliezer Lofts, FNP    amoxicillin-clavulanate (AUGMENTIN) 875-125 MG tablet Take 1 tablet by mouth every 12 (twelve) hours for 10 days. 20 tablet Eliezer Lofts, FNP   azithromycin (ZITHROMAX) 250 MG tablet Take 1 tablet (250 mg total) by mouth daily. Take first 2 tablets together, then 1 every day until finished. 6 tablet Eliezer Lofts, FNP   benzonatate (TESSALON) 200 MG capsule Take 1 capsule (200 mg total) by mouth 3 (three) times daily as needed for up to 7 days. 40 capsule Eliezer Lofts, FNP   promethazine-dextromethorphan (PROMETHAZINE-DM) 6.25-15 MG/5ML syrup Take 5 mLs by mouth 2 (two) times daily as needed for cough. 118 mL Eliezer Lofts, FNP      PDMP not reviewed this encounter.   Eliezer Lofts, [place] 05/29/22 1244

## 2022-05-29 NOTE — Discharge Instructions (Addendum)
Instructed patient to take medications as directed with food to completion.  Advised patient to take prednisone and Zithromax with first dose of Augmentin for the next 10 days.  Encouraged patient to increase daily water intake to 64 ounces per day while taking these medications.  Advised may take Tessalon Perles daily or as needed for cough.  Advised may take promethazine DM at night for cough prior to sleep due to sedate of effects.  Advised patient not to take cough medications together.  Advised if symptoms worsen and/or unresolved please follow-up with PCP or here for further evaluation.

## 2022-05-29 NOTE — ED Triage Notes (Signed)
Left side lower back pain x 2 months  Chiropractor visits 2 x week, now down to 1x  week Pain radiates down  left hip & leg  Cough related to recent RSV infection  OTC meds  ibuprofen 400 mg yesterday

## 2022-06-01 ENCOUNTER — Ambulatory Visit
Admission: EM | Admit: 2022-06-01 | Discharge: 2022-06-01 | Disposition: A | Discharge status: Home / Self Care | Payer: Medicare Other | Attending: Family Medicine | Admitting: Family Medicine

## 2022-06-01 DIAGNOSIS — M5442 Lumbago with sciatica, left side: Secondary | ICD-10-CM | POA: Diagnosis not present

## 2022-06-01 MED ORDER — TIZANIDINE HCL 4 MG PO TABS: 4.0000 mg | ORAL_TABLET | Freq: Four times a day (QID) | ORAL | 0 refills | Status: DC | PRN

## 2022-06-01 MED ORDER — HYDROCODONE-ACETAMINOPHEN 5-325 MG PO TABS: 1.0000 | ORAL_TABLET | Freq: Four times a day (QID) | ORAL | 0 refills | Status: DC | PRN

## 2022-06-01 MED ORDER — METHYLPREDNISOLONE SODIUM SUCC 125 MG IJ SOLR
80.0000 mg | Freq: Once | INTRAMUSCULAR | Status: AC
  Administered 2022-06-01: 80 mg via INTRAMUSCULAR

## 2022-06-01 NOTE — Discharge Instructions (Signed)
Use ice or heat to painful muscles Activity as tolerated Complete the steroid pack as directed and Add tizanidine as a muscle relaxer.  Use as needed to help rest I have prescribed a low-dose of hydrocodone for pain.  Take as directed See Dr. Zigmund Daniel as scheduled

## 2022-06-01 NOTE — ED Triage Notes (Signed)
Pt presents with c/o back pain/sciatica that is worsened. Tylenol ineffective. Pt to follow up with Dr. Rodena Piety on Wednesday.

## 2022-06-01 NOTE — ED Provider Notes (Signed)
[facility]    CSN: [id number] Arrival date & time: 06/01/22  1200      History   Chief Complaint Chief Complaint  Patient presents with   Back Pain    HPI Robert Hodges is a 71 y.o. male.   HPI  Patient was just seen a few days ago for cough, upper respiratory infection, community-acquired pneumonia, also had some back pain with sciatica.  States he threw up his first dose and second dose of prednisone medication.  He is a loss of the antibiotics as well.  He is separating the medications, taking it with snacks and foods now, and did better today.  His wife is here with him.  His cough was better last night.  He states his low back pain is worse.  Pain in low back.  It goes down the left leg.  Tingling and discomfort all the way to the foot.  No weakness.  No bowel or bladder complaint.  No past history of back problems.  X-rays done at the last visit to show some mild L5-S1 disc space narrowing and facet arthropathy.  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 Authorizing Provider  HYDROcodone-acetaminophen (NORCO/VICODIN) 5-325 MG tablet Take 1-2 tablets by mouth every 6 (six) hours as needed. 06/01/22  Yes Raylene Everts, MD  tiZANidine (ZANAFLEX) 4 MG tablet Take  1-2 tablets (4-8 mg total) by mouth every 6 (six) hours as needed for muscle spasms. 06/01/22  Yes Raylene Everts, MD  amoxicillin-clavulanate (AUGMENTIN) 875-125 MG tablet Take 1 tablet by mouth every 12 (twelve) hours for 10 days. 05/29/22 06/08/22  Eliezer Lofts, FNP  azithromycin (ZITHROMAX) 250 MG tablet Take 1 tablet (250 mg total) by mouth daily. Take first 2 tablets together, then 1 every day until finished. 05/29/22   Eliezer Lofts, FNP  benzonatate (TESSALON) 200 MG capsule Take 1 capsule (200 mg total) by mouth 3 (three) times daily as needed for up to 7 days. 05/29/22 06/05/22  Eliezer Lofts, FNP  carbamazepine (TEGRETOL) 200 MG tablet Take 1 tablet (200 mg total) by mouth daily. 03/05/22   Luetta Nutting, DO  famotidine (PEPCID) 20 MG tablet Take 1 tablet (20 mg total) by mouth 2 (two) times daily. 03/05/22   Luetta Nutting, DO  metroNIDAZOLE (METROGEL) 0.75 % gel Apply topically 2 (two) times daily. 03/05/22   Luetta Nutting, DO  predniSONE (STERAPRED UNI-PAK 21 TAB) 10 MG (21) TBPK tablet Take by mouth daily. Take 6 tabs by mouth daily  for 2 days, then 5 tabs for 2 days, then 4 tabs for 2 days, then 3 tabs for 2 days, 2 tabs for 2 days, then 1 tab by mouth daily for 2 days 05/29/22   Eliezer Lofts, FNP  promethazine-dextromethorphan (PROMETHAZINE-DM) 6.25-15 MG/5ML syrup Take 5 mLs by mouth 2 (two) times daily as needed for cough. 05/29/22   Eliezer Lofts, FNP    Family History Family History  Problem Relation Age of Onset   Heart failure Mother     Social History Social History   Tobacco Use   Smoking status: Former   Smokeless tobacco: Never  Scientific laboratory technician Use: Never used  Substance Use Topics   Alcohol use: Yes    Comment: occassional   Drug use: Yes    Types: Other-see comments    Comment: cbd gummies     Allergies   Patient has no known allergies.   Review of Systems Review of Systems  See HPI Physical Exam Triage Vital Signs ED Triage  Vitals  Enc Vitals Group     BP 06/01/22 1209 (!) 146/79     Pulse Rate 06/01/22 1209 (!) 51     Resp 06/01/22 1209 14     Temp 06/01/22 1209 97.8 F (36.6 C)     Temp Source 06/01/22 1209 Oral     SpO2 06/01/22 1209 98 %     Weight --      Height --      Head Circumference --      Peak Flow --      Pain Score 06/01/22 1208 10     Pain Loc --      Pain Edu? --      Excl. in [place]? --    No data found.  Updated Vital Signs BP (!) 146/79 (BP Location: Right Arm)   Pulse (!) 59   Temp 97.8 F (36.6 C) (Oral)   Resp 14   SpO2 98%      Physical Exam Constitutional:      General: He is in acute distress.     Appearance: He is well-developed.     Comments: Uncomfortable.  Stiff and guarded movements.  Resists movement.  Prefers resting on exam table  HENT:     Head: Normocephalic and atraumatic.  Eyes:     Conjunctiva/sclera: Conjunctivae normal.     Pupils: Pupils are equal, round, and reactive to light.  Cardiovascular:     Rate and Rhythm: Normal rate.  Pulmonary:     Effort: Pulmonary effort is normal. No respiratory distress.  Abdominal:     General: There is no distension.     Palpations: Abdomen is soft.  Musculoskeletal:        General: No swelling, tenderness, deformity or signs of injury. Normal range of motion.     Cervical back: Normal range of motion.     Comments: Very mild tenderness to palpation of the left SI joint.  No palpable muscle spasm.  verry Limited range of motion of back secondary to pain.  Straight leg raise is negative on the right.  On the left it is positive at 45 degrees for increased back and thigh pain.  No sensory deficit.  Strength is symmetric  Skin:    General: Skin is warm and dry.  Neurological:     Mental Status: He is alert.      UC Treatments / Results  Labs (all labs ordered are listed, but only abnormal results are displayed) Labs Reviewed - No data to display  EKG   Radiology No results  found.  Procedures Procedures (including critical care time)  Medications Ordered in UC Medications  methylPREDNISolone sodium succinate (SOLU-MEDROL) 125 mg/2 mL injection 80 mg (  80 mg Intramuscular Given 06/01/22 1241)    Initial Impression / Assessment and Plan / UC Course  I have reviewed the triage vital signs and the nursing notes.  Pertinent labs & imaging results that were available during my care of the patient were reviewed by me and considered in my medical decision making (see chart for details).     Increased rediculopathy sx with emesis causing decreased steroid dose. Final Clinical Impressions(s) / UC Diagnoses   Final diagnoses:  Acute midline low back pain with left-sided sciatica     Discharge Instructions      Use ice or heat to painful muscles Activity as tolerated Complete the steroid pack as directed and Add tizanidine as a muscle relaxer.  Use as needed to help rest I have prescribed a low-dose of hydrocodone for pain.  Take as directed See Dr. Zigmund Daniel as scheduled   ED Prescriptions     Medication Sig Dispense Auth. Provider   HYDROcodone-acetaminophen (NORCO/VICODIN) 5-325 MG tablet Take 1-2 tablets by mouth every 6 (six) hours as needed. 15 tablet Raylene Everts, MD   tiZANidine (ZANAFLEX) 4 MG tablet Take 1-2 tablets (4-8 mg total) by mouth every 6 (six) hours as needed for muscle spasms. 21 tablet Raylene Everts, MD      I have reviewed the PDMP during this encounter.   Raylene Everts, MD 06/01/22 1311

## 2022-06-03 ENCOUNTER — Encounter: Payer: Self-pay | Admitting: Family Medicine

## 2022-06-03 ENCOUNTER — Ambulatory Visit: Payer: Medicare Other | Admitting: Family Medicine

## 2022-06-03 VITALS — BP 155/90 | HR 76 | Ht 71.0 in | Wt 185.0 lb

## 2022-06-03 DIAGNOSIS — M5442 Lumbago with sciatica, left side: Secondary | ICD-10-CM | POA: Diagnosis not present

## 2022-06-03 DIAGNOSIS — J189 Pneumonia, unspecified organism: Secondary | ICD-10-CM | POA: Diagnosis not present

## 2022-06-03 MED ORDER — TIZANIDINE HCL 4 MG PO TABS: 4.0000 mg | ORAL_TABLET | Freq: Four times a day (QID) | ORAL | 0 refills | Status: DC | PRN

## 2022-06-03 MED ORDER — HYDROCODONE-ACETAMINOPHEN 5-325 MG PO TABS: 1.0000 | ORAL_TABLET | Freq: Four times a day (QID) | ORAL | 0 refills | Status: DC | PRN

## 2022-06-03 NOTE — Assessment & Plan Note (Signed)
Continues to have low back pain.  Has some mild weakness in the L leg. Will continue muscle relaxer and pain medication for now.  Prescriptions renewed.  No red flag symptoms at this time.  Referral placed to PT as I think dry needling may be helpful.  May need advanced imaging if having worsening or un-resolving symptoms.

## 2022-06-03 NOTE — Progress Notes (Signed)
Robert Hodges - 71 y.o. male MRN [id number]  Date of birth: [birth year]  Subjective No chief complaint on file.   HPI Robert Hodges is a 71 y.o. male here today for follow up of recent urgent care visit.   Seen on 12/15 and 12/18 at urgent care.  Had dx of CAP and appropriately stated on augmentin and azithromycin. Had back pain at that time with radiation into the left leg.  Prednisone added as well.  Seen again on the 18th for worsening back pain.  Tizanidine and norco added with recommendation to f/u with PCP.  Xrays of the lumbar spine with mild L5-S1 DDD and facet disease.  Reports that he continues to have pain.  Difficult to find a comfortable position.  Norco does provide some relief.  Prednisone did not seem to help a whole lot.  Denies bowel or bladder incontinence.  Muscle relaxer seems to be effective in helping with his pain.   His cough is improved and he feels like energy is improved.  He does have a few days of antibiotics left.   ROS:  A comprehensive ROS was completed and negative except as noted per HPI   No Known Allergies  Past Medical History:  Diagnosis Date   Seizure disorder [facility])     Past Surgical History:  Procedure Laterality Date   pudendal nerve  pudendal nerve    surgery    Social History   Socioeconomic History   Marital status: Married    Spouse name: Not on file   Number of children: Not on file   Years of education: Not on file   Highest education level: Not on file  Occupational History   Not on file  Tobacco Use   Smoking status: Former   Smokeless tobacco: Never  Vaping Use   Vaping Use: Never used  Substance and Sexual Activity   Alcohol use: Yes    Comment: occassional   Drug use: Yes    Types: Other-see comments    Comment: cbd gummies   Sexual activity: Not Currently    Partners: Female  Other Topics Concern   Not on file  Social History Narrative   Not on file   Social Determinants of Health   Financial Resource  Strain: Low Risk  (02/22/2021)   Overall Financial Resource Strain (CARDIA)    Difficulty of Paying Living Expenses: Not hard at all  Food Insecurity: No Food Insecurity (02/22/2021)   Hunger Vital Sign    Worried About Running Out of Food in the Last Year: Never true    Ran Out of Food in the Last Year: Never true  Transportation Needs: No Transportation Needs (02/22/2021)   PRAPARE - Hydrologist (Medical): No    Lack of Transportation (Non-Medical): No  Physical Activity: Sufficiently Active (02/22/2021)   Exercise Vital Sign    Days of Exercise per Week: 5 days    Minutes of Exercise per Session: 30 min  Stress: No Stress Concern Present (02/22/2021)   [place]    Feeling of Stress : Not at all  Social Connections: Moderately Integrated (02/22/2021)   Social Connection and Isolation Panel [NHANES]    Frequency of Communication with Friends and Family: Once a week    Frequency of Social Gatherings with Friends and Family: Never    Attends Religious Services: More than 4 times per year    Active Member of Clubs or  Organizations: Yes    Attends Archivist Meetings: 1 to 4 times per year    Marital Status: Married    Family History  Problem Relation Age of Onset   Heart failure Mother     Health Maintenance  Topic Date Due   Medicare Annual Wellness (AWV)  02/22/2022   COVID-19 Vaccine (1) 07/16/2022 (Originally 01/06/1956)   Zoster Vaccines- Shingrix (1 of 2) 07/16/2022 (Originally 01/07/1968)   Pneumonia Vaccine 26+ Years old (1 - PCV) 09/06/2022 (Originally 01/06/2014)   COLONOSCOPY (Pts 45-73yr Insurance coverage will need to be confirmed)  03/06/2023 (Originally 11/05/2021)   DTaP/Tdap/Td (3 - Td or Tdap) 01/13/2024   Hepatitis C Screening  Completed   HPV VACCINES  Aged Out   INFLUENZA VACCINE  Discontinued      ----------------------------------------------------------------------------------------------------------------------------------------------------------------------------------------------------------------- Physical Exam BP (!) 155/90 (BP Location: Left Arm, Patient Position: Sitting, Cuff Size: Normal)   Pulse 76   Ht '5\' 11"'$  (1.803 m)   Wt 185 lb (83.9 kg)   SpO2 96%   BMI 25.80 kg/m   Physical Exam Constitutional:      Appearance: Normal appearance.  HENT:     Head: Normocephalic and atraumatic.  Eyes:     General: No scleral icterus. Musculoskeletal:        General: Tenderness (TTP along L sided lumbar paraspinals and over SI joint area.) present.     Comments: Weakness with leg extension and slightly reduced achilles reflex on L.   Neurological:     Mental Status: He is alert.  Psychiatric:        Mood and Affect: Mood normal.        Behavior: Behavior normal.     ------------------------------------------------------------------------------------------------------------------------------------------------------------------------------------------------------------------- Assessment and Plan  Acute left-sided low back pain with left-sided sciatica Continues to have low back pain.  Has some mild weakness in the L leg. Will continue muscle relaxer and pain medication for now.  Prescriptions renewed.  No red flag symptoms at this time.  Referral placed to PT as I think dry needling may be helpful.  May need advanced imaging if having worsening or un-resolving symptoms.   CAP (community acquired pneumonia) Improving.  Recommend completion of antibiotics.    Meds ordered this encounter  Medications   HYDROcodone-acetaminophen (NORCO/VICODIN) 5-325 MG tablet    Sig: Take 1-2 tablets by mouth every 6 (six) hours as needed.    Dispense:  30 tablet    Refill:  0   tiZANidine (ZANAFLEX) 4 MG tablet    Sig: Take 1-2 tablets (4-8 mg total) by mouth every 6 (six) hours  as needed for muscle spasms.    Dispense:  21 tablet    Refill:  0    No follow-ups on file.    This visit occurred during the SARS-CoV-2 public health emergency.  Safety protocols were in place, including screening questions prior to the visit, additional usage of staff PPE, and extensive cleaning of exam room while observing appropriate contact time as indicated for disinfecting solutions.

## 2022-06-03 NOTE — Therapy (Signed)
OUTPATIENT PHYSICAL THERAPY THORACOLUMBAR EVALUATION   Patient Name: Robert Hodges MRN: [id number] DOB:27-Jan-1951, 71 y.o., male Today's Date: 06/04/2022  END OF SESSION:  PT End of Session - 06/04/22 1016     Visit Number 1    Number of Visits 16    Date for PT Re-Evaluation 07/30/22    PT Start Time 1015    PT Stop Time 1105    PT Time Calculation (min) 50 min    Activity Tolerance Patient limited by pain             Past Medical History:  Diagnosis Date   Seizure disorder [facility])    Past Surgical History:  Procedure Laterality Date   pudendal nerve  pudendal nerve    surgery   Patient Active Problem List   Diagnosis Date Noted   Acute left-sided low back pain with left-sided sciatica 06/03/2022   CAP (community acquired pneumonia) 06/03/2022   Lipoma left thigh 03/11/2021   Well adult exam 03/04/2021   Dysphagia 11/02/2016   Colon polyp 11/02/2016   Bradycardia 01/28/2016   GERD (gastroesophageal reflux disease) 10/23/2015   Secondary hyperparathyroidism (HCC) 10/01/2015   External resorption of root of tooth 09/25/2015   Hyperlipidemia 06/20/2008   IMPAIRED FASTING GLUCOSE 03/16/2007   Epilepsy (HCC) 01/18/2007    PCP: Dr Everrett Coombe  REFERRING PROVIDER: Dr Everrett Coombe  REFERRING DIAG: Acute Lt sided LBP w/ Lt sciatica  Rationale for Evaluation and Treatment: Rehabilitation  THERAPY DIAG:  Other low back pain  Other symptoms and signs involving the musculoskeletal system  Muscle weakness (generalized)  Radiculopathy, lumbar region  ONSET DATE: 03/29/22  SUBJECTIVE:      Patient lying in floor in waiting area - unable to sit without increased pain.                                                                                                                                                                                        SUBJECTIVE STATEMENT: Patient reports onset of LBP ad Lt LE pain over the past 2 months after lifting some  weights at the gym. Lt LE pain started about 1 month with increased intensity in the past few weeks. He has also had pneumonia and the coughing has increased the back and LE E pain. He was seen in urgent care x 2 and treated with meds with no significant change.   PERTINENT HISTORY:  Seen in urgent care 05/29/22 and 06/02/22 for acute LBP. No significant change to prednisone. Muscle relaxants seems to help some with sleep. History of pudendal nerve surgery 2010; denies any other medical problems   PAIN:  Are  you having pain? Yes: NPRS scale: 0/10 with medication and lying down - 6-8/10 with walking  Pain location: Lt buttocks; outer hip/calf and top of Lt ankle  Pain description: fierce; ache  Aggravating factors: standing; walking Relieving factors: lying on back   PRECAUTIONS: None  WEIGHT BEARING RESTRICTIONS: No  FALLS:  Has patient fallen in last 6 months? No  LIVING ENVIRONMENT: Lives with: lives with their family and lives with their spouse Lives in: House/apartment Stairs: No Has following equipment at home: Crutches, Tour manager, and Grab bars  OCCUPATION: retired from Product manager in 2014; now active yard work, remodeling, zero gravity work station for computer work; lazy boy recline   PLOF: Independent  PATIENT GOALS: get rid of the pain   NEXT MD VISIT: to schedule   OBJECTIVE:   DIAGNOSTIC FINDINGS:  Xray 05/29/22: lumbar spine - Mild posterior L5-S1 disc space narrowing, Mild L5-S1 facet joint arthropathy, Mild L5-S1 degenerative disc and facet disease.  PATIENT SURVEYS:  FOTO 10 goal 40  SCREENING FOR RED FLAGS: Bowel or bladder incontinence: No Spinal tumors: No Cauda equina syndrome: No Compression fracture: No Abdominal aneurysm: No  COGNITION: Overall cognitive status: Within functional limits for tasks assessed     SENSATION: Numbness and tingling Lt LB to buttock, lateral thigh to top of foot   MUSCLE LENGTH: Hamstrings: Right 65  deg; Left 60 deg   POSTURE:  rounded shoulders, forward head, decreased lumbar lordosis, and flexed trunk   PALPATION: Tightness with PA and Lt lateral mobs through lower lumbar spine; muscular tightness in the Lt posterior   LUMBAR ROM: limited throughout - patient did not tolerate standing for lumbar ROM testing   LOWER EXTREMITY STRENGTH:     Active  Right eval Left eval  Hip flexion 5/5 4+/5  Hip extension 5/5 5-/5  Hip abduction 5/5 5-/5  Hip adduction    Hip internal rotation    Hip external rotation    Knee flexion    Knee extension    Ankle dorsiflexion 5/5 3+/5  Ankle plantarflexion    Great toe extension  5/5 3+/5       (Blank rows = not tested)  LOWER EXTREMITY ROM: ROM bilat LE's WFL's       LUMBAR SPECIAL TESTS:  Straight leg raise test: Negative and Slump test: not tested  GAIT: Distance walked: 40 feet Assistive device utilized: Crutches Level of assistance: Independent Comments: abnormal gait pattern with crutches - LE's in ER Lt > Rt; decreased weight bearing Lt LE   TODAY'S TREATMENT:                                                                                                                              DATE: 06/04/22  [facility] Adult PT Treatment:  DATE: 06/04/22 Therapeutic Exercise: Prone press up 2 sec hold x 10; x 5  Manual Therapy: Deep tissue work through the Lt lumbar musculature. Skilled palpation to assess response to manual work and DN  Trigger Point Dry-Needling  Treatment instructions: Expect mild to moderate muscle soreness. S/S of pneumothorax if dry needled over a lung field, and to seek immediate medical attention should they occur. Patient verbalized understanding of these instructions and education.  Patient Consent Given: Yes Education handout provided: Yes Muscles treated: Lt QL; lumbar paraspinals  Electrical stimulation performed: No Parameters: N/A Treatment  response/outcome: decreased palpable tightness   Neuromuscular re-ed: Education re positions for sitting and lying down  Therapeutic Activity:  Gait:  Modalities:  Self Care: Education re- extension program - to avoid sitting and work on neurtral spine position   PATIENT EDUCATION:  Education details: POC; HEP  Person educated: Patient Education method: Programmer, multimedia, Facilities manager, Actor cues, Verbal cues, and Handouts Education comprehension: verbalized understanding, returned demonstration, verbal cues required, tactile cues required, and needs further education  HOME EXERCISE PROGRAM: Access Code: [email or web address] URL: [email or web address] Date: 06/04/2022 Prepared by: Corlis Leak  Exercises - Prone Press Up  - 2 x daily - 7 x weekly - 1 sets - 10 reps - 2-3 sec  hold - Standing Lumbar Extension  - 2 x daily - 7 x weekly - 1 sets - 2-3 reps - 2-3 sec  hold - Supine Transversus Abdominis Bracing with Pelvic Floor Contraction  - 2 x daily - 7 x weekly - 1 sets - 10 reps - 10sec  hold  Patient Education - Hospital doctor - Office Posture - Trigger Point Dry Needling  ASSESSMENT:  CLINICAL IMPRESSION: Patient is a 71 y.o. male who was seen today for physical therapy evaluation and treatment for acute Lt sided LBP with Lt sciatica with onset of symptoms over the past 2 months, increasing in the past few weeks. He has increased pain with sitting and functional activities. He feels best when lying down. Patient is unable to tolerated full assessment due to pain. He has limited trunk mobility; muscular tightness in the Lt lumbar spine musculature into Lt posterior hip; Lt LE weakness; pain and limited functional activity. He will try 2 more sessions of therapy with focus on extension program and trial of dry needling Jesiah will schedule return appointment with Dr Ashley Royalty 06/12/22 or the first of the following week. If he has noticed improvement in pain and Lt LE  weakness he will go to appointment he will postpone MD appointment and continue PT treatment. Patient will benefit from PT to address problems identified.   OBJECTIVE IMPAIRMENTS: Abnormal gait, decreased activity tolerance, decreased balance, decreased endurance, decreased mobility, difficulty walking, decreased ROM, decreased strength, increased fascial restrictions, impaired sensation, postural dysfunction, and pain.   ACTIVITY LIMITATIONS: carrying, lifting, bending, sitting, standing, squatting, transfers, bathing, toileting, dressing, and locomotion level  PARTICIPATION LIMITATIONS: meal prep, cleaning, community activity, yard work, sitting, walking  PERSONAL FACTORS: Fitness and Time since onset of injury/illness/exacerbation are also affecting patient's functional outcome.   REHAB POTENTIAL: Good  CLINICAL DECISION MAKING: Stable/uncomplicated  EVALUATION COMPLEXITY: Low   GOALS: Goals reviewed with patient? Yes  SHORT TERM GOALS: Target date: 07/02/2022    Independent in initial HEP  Baseline: Goal status: INITIAL  2.  Increase Lt LE strength by 1/2 to 1 muscle grade  Baseline:  Goal status: INITIAL    LONG TERM GOALS: Target date: 07/30/2022    Decrease pain by  75-100% allowing patient to return to normal functional activities  Baseline:  Goal status: INITIAL  2.  Gait for community distances without assistive device and normal gait pattern  Baseline:  Goal status: INITIAL  3.  5/5 strength Lt LE  Baseline:  Goal status: INITIAL  4.  Independent in HEP including aquatic program as indicated  Baseline:  Goal status: INITIAL  5.  Improve functional limitation score to 40  Baseline:  Goal status: INITIAL    PLAN:  PT FREQUENCY: 2x/week  PT DURATION: 8 weeks  PLANNED INTERVENTIONS: Therapeutic exercises, Therapeutic activity, Neuromuscular re-education, Balance training, Gait training, Patient/Family education, Self Care, Joint mobilization,  Aquatic Therapy, Dry Needling, Electrical stimulation, Spinal mobilization, Cryotherapy, Moist heat, Taping, Traction, Ultrasound, Ionotophoresis 4mg /ml Dexamethasone, Manual therapy, and Re-evaluation.  PLAN FOR NEXT SESSION: review and progress HEP; continue with back care and body mechanics education; manual work, DN, modalities as indicated    [employer], PT 06/04/2022, 10:21 AM

## 2022-06-03 NOTE — Assessment & Plan Note (Signed)
Improving.  Recommend completion of antibiotics.

## 2022-06-04 ENCOUNTER — Other Ambulatory Visit: Payer: Self-pay

## 2022-06-04 ENCOUNTER — Ambulatory Visit: Payer: Medicare Other | Attending: Family Medicine | Admitting: Rehabilitative and Restorative Service Providers"

## 2022-06-04 DIAGNOSIS — M5442 Lumbago with sciatica, left side: Secondary | ICD-10-CM | POA: Insufficient documentation

## 2022-06-04 DIAGNOSIS — M6281 Muscle weakness (generalized): Secondary | ICD-10-CM

## 2022-06-04 DIAGNOSIS — M5416 Radiculopathy, lumbar region: Secondary | ICD-10-CM | POA: Diagnosis not present

## 2022-06-04 DIAGNOSIS — R29898 Other symptoms and signs involving the musculoskeletal system: Secondary | ICD-10-CM | POA: Diagnosis not present

## 2022-06-04 DIAGNOSIS — M5459 Other low back pain: Secondary | ICD-10-CM

## 2022-06-09 ENCOUNTER — Encounter: Payer: Self-pay | Admitting: Physical Therapy

## 2022-06-09 ENCOUNTER — Ambulatory Visit: Payer: Medicare Other | Admitting: Physical Therapy

## 2022-06-09 DIAGNOSIS — M5416 Radiculopathy, lumbar region: Secondary | ICD-10-CM | POA: Diagnosis not present

## 2022-06-09 DIAGNOSIS — M6281 Muscle weakness (generalized): Secondary | ICD-10-CM | POA: Diagnosis not present

## 2022-06-09 DIAGNOSIS — M5459 Other low back pain: Secondary | ICD-10-CM

## 2022-06-09 DIAGNOSIS — R29898 Other symptoms and signs involving the musculoskeletal system: Secondary | ICD-10-CM | POA: Diagnosis not present

## 2022-06-09 DIAGNOSIS — M5442 Lumbago with sciatica, left side: Secondary | ICD-10-CM | POA: Diagnosis not present

## 2022-06-09 NOTE — Therapy (Signed)
OUTPATIENT PHYSICAL THERAPY THORACOLUMBAR EVALUATION   Patient Name: Robert Hodges MRN: [id number] DOB:18-Jul-1950, 71 y.o., male Today's Date: 06/09/2022  END OF SESSION:  PT End of Session - 06/09/22 1013     Visit Number 2    Number of Visits 16    Date for PT Re-Evaluation 07/30/22    PT Start Time 3154    PT Stop Time 1100    PT Time Calculation (min) 45 min    Activity Tolerance Patient limited by pain              Past Medical History:  Diagnosis Date   Seizure disorder [facility])    Past Surgical History:  Procedure Laterality Date   pudendal nerve  pudendal nerve    surgery   Patient Active Problem List   Diagnosis Date Noted   Acute left-sided low back pain with left-sided sciatica 06/03/2022   CAP (community acquired pneumonia) 06/03/2022   Lipoma left thigh 03/11/2021   Well adult exam 03/04/2021   Dysphagia 11/02/2016   Colon polyp 11/02/2016   Bradycardia 01/28/2016   GERD (gastroesophageal reflux disease) 10/23/2015   Secondary hyperparathyroidism ([place]) 10/01/2015   External resorption of root of tooth 09/25/2015   Hyperlipidemia 06/20/2008   IMPAIRED FASTING GLUCOSE 03/16/2007   Epilepsy ([place]) 01/18/2007    PCP: Dr Luetta Nutting  REFERRING PROVIDER: Dr Luetta Nutting  REFERRING DIAG: Acute Lt sided LBP w/ Lt sciatica  Rationale for Evaluation and Treatment: Rehabilitation  THERAPY DIAG:  Other low back pain  Other symptoms and signs involving the musculoskeletal system  Muscle weakness (generalized)  Radiculopathy, lumbar region  ONSET DATE: 03/29/22  SUBJECTIVE:      SUBJECTIVE STATEMENT: Pt reports he was able to do his exercises daily except Christmas day -- pt notes he was in a lot of pain.  Pt states he did not have anymore of his medications.   PERTINENT HISTORY:  Seen in urgent care 05/29/22 and 06/02/22 for acute LBP. No significant change to prednisone. Muscle relaxants seems to help some with sleep. History of pudendal  nerve surgery 2010; denies any other medical problems   From eval: Patient reports onset of LBP ad Lt LE pain over the past 2 months after lifting some weights at the gym. Lt LE pain started about 1 month with increased intensity in the past few weeks. He has also had pneumonia and the coughing has increased the back and LE E pain. He was seen in urgent care x 2 and treated with meds with no significant change.   PAIN:  Are you having pain? Yes: NPRS scale: 0/10 with medication and lying down - 6-8/10 with walking  Pain location: Lt buttocks; outer hip/calf and top of Lt ankle  Pain description: fierce; ache  Aggravating factors: standing; walking Relieving factors: lying on back   PRECAUTIONS: None  WEIGHT BEARING RESTRICTIONS: No  FALLS:  Has patient fallen in last 6 months? No  LIVING ENVIRONMENT: Lives with: lives with their family and lives with their spouse Lives in: House/apartment Stairs: No Has following equipment at home: [place] bench, and Grab bars  OCCUPATION: retired from Occupational psychologist in 2014; now active yard work, remodeling, zero gravity work station for computer work; lazy boy recline   PLOF: Independent  PATIENT GOALS: get rid of the pain   NEXT MD VISIT: to schedule   OBJECTIVE:   DIAGNOSTIC FINDINGS:  Xray 05/29/22: lumbar spine - Mild posterior L5-S1 disc space narrowing, Mild L5-S1 facet joint  arthropathy, Mild L5-S1 degenerative disc and facet disease.  SENSATION: Numbness and tingling Lt LB to buttock, lateral thigh to top of foot   POSTURE:  rounded shoulders, forward head, decreased lumbar lordosis, and flexed trunk   PALPATION: Tightness with PA and Lt lateral mobs through lower lumbar spine; muscular tightness in the Lt posterior   LUMBAR ROM: limited throughout - patient did not tolerate standing for lumbar ROM testing   LOWER EXTREMITY STRENGTH:     Active  Right eval Left eval  Hip flexion 5/5 4+/5  Hip extension  5/5 5-/5  Hip abduction 5/5 5-/5  Hip adduction    Hip internal rotation    Hip external rotation    Knee flexion    Knee extension    Ankle dorsiflexion 5/5 3+/5  Ankle plantarflexion    Great toe extension  5/5 3+/5       (Blank rows = not tested)  LUMBAR SPECIAL TESTS:  Straight leg raise test: Negative and Slump test: not tested  GAIT: Distance walked: 40 feet Assistive device utilized: Crutches Level of assistance: Independent Comments: abnormal gait pattern with crutches - LE's in ER Lt > Rt; decreased weight bearing Lt LE   [facility] Adult PT Treatment:                                                DATE: 06/09/22 Therapeutic Exercise: Nustep L4 x 5 min LEs only Prone hip ext 2x10 Prone press up 2 sec hold x5 Supine piriformis stretch 2x 30 sec Supine sciatic nerve glide x10 Supine ab set 10x5 sec Supine ab set with clamshell green TB 2x10x5 sec Supine ab set + SLR x10  LTR 2x30 sec Seated hamstring stretch 2x30 sec Gastroc stretch 2x30 sec Manual Therapy: Deep tissue work through the [employer] lumbar musculature, Lt piriformis/glutes Engineer, maintenance (IT) and UPAs lumbar spine  [facility] Adult PT Treatment:                                                DATE: 06/04/22 Therapeutic Exercise: Prone press up 2 sec hold x 10; x 5  Manual Therapy: Deep tissue work through the [employer] lumbar musculature. Skilled palpation to assess response to manual work and DN  Trigger Point Dry-Needling  Treatment instructions: Expect mild to moderate muscle soreness. S/S of pneumothorax if dry needled over a lung field, and to seek immediate medical attention should they occur. Patient verbalized understanding of these instructions and education.  Patient Consent Given: Yes Education handout provided: Yes Muscles treated: Lt QL; lumbar paraspinals  Electrical stimulation performed: No Parameters: N/A Treatment response/outcome: decreased palpable tightness   Neuromuscular re-ed: Education re positions for sitting  and lying down  Self Care: Education re- extension program - to avoid sitting and work on neurtral spine position   PATIENT EDUCATION:  Education details: POC; HEP  Person educated: Patient Education method: Consulting civil engineer, Media planner, Corporate treasurer cues, Verbal cues, and Handouts Education comprehension: verbalized understanding, returned demonstration, verbal cues required, tactile cues required, and needs further education  HOME EXERCISE PROGRAM: Access Code: [email or web address] URL: [email or web address] Date: 06/09/2022 Prepared by: Estill Bamberg April Thurnell Garbe  Exercises - Prone Press Up  - 2 x daily - 7 x weekly - 1 sets -  10 reps - 2-3 sec  hold - Standing Lumbar Extension  - 2 x daily - 7 x weekly - 1 sets - 2-3 reps - 2-3 sec  hold - Supine Transversus Abdominis Bracing with Pelvic Floor Contraction  - 2 x daily - 7 x weekly - 1 sets - 10 reps - 10sec  hold - Prone Hip Extension  - 1 x daily - 7 x weekly - 2 sets - 10 reps - Supine Sciatic Nerve Glide  - 1 x daily - 7 x weekly - 2 sets - 5 reps - Supine Piriformis Stretch with Leg Straight  - 1 x daily - 7 x weekly - 2 sets - 30 sec hold  ASSESSMENT:  CLINICAL IMPRESSION: Pt reports he was not sure if TPDN did much for him. Continued manual work to address lumbar and hip tightness. Session focused primarily on improving nerve impingement with sciatic nerve glides and stretching for posterior leg. Initiated L LE strengthening with core strengthening.    OBJECTIVE IMPAIRMENTS: Abnormal gait, decreased activity tolerance, decreased balance, decreased endurance, decreased mobility, difficulty walking, decreased ROM, decreased strength, increased fascial restrictions, impaired sensation, postural dysfunction, and pain.   ACTIVITY LIMITATIONS: carrying, lifting, bending, sitting, standing, squatting, transfers, bathing, toileting, dressing, and locomotion level  PARTICIPATION LIMITATIONS: meal prep, cleaning, community activity, yard  work, sitting, walking  PERSONAL FACTORS: Fitness and Time since onset of injury/illness/exacerbation are also affecting patient's functional outcome.   REHAB POTENTIAL: Good  CLINICAL DECISION MAKING: Stable/uncomplicated  EVALUATION COMPLEXITY: Low   GOALS: Goals reviewed with patient? Yes  SHORT TERM GOALS: Target date: 07/02/2022    Independent in initial HEP  Baseline: Goal status: INITIAL  2.  Increase Lt LE strength by 1/2 to 1 muscle grade  Baseline:  Goal status: INITIAL    LONG TERM GOALS: Target date: 07/30/2022    Decrease pain by 75-100% allowing patient to return to normal functional activities  Baseline:  Goal status: INITIAL  2.  Gait for community distances without assistive device and normal gait pattern  Baseline:  Goal status: INITIAL  3.  5/5 strength Lt LE  Baseline:  Goal status: INITIAL  4.  Independent in HEP including aquatic program as indicated  Baseline:  Goal status: INITIAL  5.  Improve functional limitation score to 40  Baseline:  Goal status: INITIAL    PLAN:  PT FREQUENCY: 2x/week  PT DURATION: 8 weeks  PLANNED INTERVENTIONS: Therapeutic exercises, Therapeutic activity, Neuromuscular re-education, Balance training, Gait training, Patient/Family education, Self Care, Joint mobilization, Aquatic Therapy, Dry Needling, Electrical stimulation, Spinal mobilization, Cryotherapy, Moist heat, Taping, Traction, Ultrasound, Ionotophoresis '4mg'$ /ml Dexamethasone, Manual therapy, and Re-evaluation.  PLAN FOR NEXT SESSION: review and progress HEP; continue with back care and body mechanics education; manual work, DN, modalities as indicated    Darryon Bastin April Ma L Addiel Mccardle, PT 06/09/2022, 10:13 AM

## 2022-06-11 ENCOUNTER — Ambulatory Visit: Payer: Medicare Other | Admitting: Physical Therapy

## 2022-06-11 ENCOUNTER — Encounter: Payer: Self-pay | Admitting: Physical Therapy

## 2022-06-11 DIAGNOSIS — R29898 Other symptoms and signs involving the musculoskeletal system: Secondary | ICD-10-CM

## 2022-06-11 DIAGNOSIS — M5416 Radiculopathy, lumbar region: Secondary | ICD-10-CM

## 2022-06-11 DIAGNOSIS — M5442 Lumbago with sciatica, left side: Secondary | ICD-10-CM | POA: Diagnosis not present

## 2022-06-11 DIAGNOSIS — M5459 Other low back pain: Secondary | ICD-10-CM

## 2022-06-11 DIAGNOSIS — M6281 Muscle weakness (generalized): Secondary | ICD-10-CM

## 2022-06-11 NOTE — Therapy (Addendum)
OUTPATIENT PHYSICAL THERAPY TREATMENT AND DISCHARGE   Patient Name: Robert Hodges MRN: [id number] DOB:1951-03-03, 71 y.o., male Today's Date: 06/11/2022  END OF SESSION:  PT End of Session - 06/11/22 2952     Visit Number 3    Number of Visits 16    Date for PT Re-Evaluation 07/30/22    PT Start Time 0925    PT Stop Time 1010    PT Time Calculation (min) 45 min    Activity Tolerance Patient limited by pain            PHYSICAL THERAPY DISCHARGE SUMMARY  Visits from Start of Care: 3  Current functional level related to goals / functional outcomes: See below   Remaining deficits: See below   Education / Equipment: See below   Patient agrees to discharge. Patient goals were not met. Patient is being discharged due to not returning since the last visit.    Past Medical History:  Diagnosis Date   Seizure disorder [facility])    Past Surgical History:  Procedure Laterality Date   pudendal nerve  pudendal nerve    surgery   Patient Active Problem List   Diagnosis Date Noted   Acute left-sided low back pain with left-sided sciatica 06/03/2022   CAP (community acquired pneumonia) 06/03/2022   Lipoma left thigh 03/11/2021   Well adult exam 03/04/2021   Dysphagia 11/02/2016   Colon polyp 11/02/2016   Bradycardia 01/28/2016   GERD (gastroesophageal reflux disease) 10/23/2015   Secondary hyperparathyroidism ([place]) 10/01/2015   External resorption of root of tooth 09/25/2015   Hyperlipidemia 06/20/2008   IMPAIRED FASTING GLUCOSE 03/16/2007   Epilepsy ([place]) 01/18/2007    PCP: Dr Luetta Nutting  REFERRING PROVIDER: Dr Luetta Nutting  REFERRING DIAG: Acute Lt sided LBP w/ Lt sciatica  Rationale for Evaluation and Treatment: Rehabilitation  THERAPY DIAG:  Other low back pain  Other symptoms and signs involving the musculoskeletal system  Muscle weakness (generalized)  Radiculopathy, lumbar region  ONSET DATE: 03/29/22  SUBJECTIVE:      SUBJECTIVE  STATEMENT: Pt reports he tried the press up exercise but it "really squeezes the nerve." Pt states he felt pretty good after last session. Had a bad day yesterday and couldn't do all exercises.   PERTINENT HISTORY:  Seen in urgent care 05/29/22 and 06/02/22 for acute LBP. No significant change to prednisone. Muscle relaxants seems to help some with sleep. History of pudendal nerve surgery 2010; denies any other medical problems   From eval: Patient reports onset of LBP ad Lt LE pain over the past 2 months after lifting some weights at the gym. Lt LE pain started about 1 month with increased intensity in the past few weeks. He has also had pneumonia and the coughing has increased the back and LE E pain. He was seen in urgent care x 2 and treated with meds with no significant change.   PAIN:  Are you having pain? Yes: NPRS scale: 0/10 with medication and lying down - 6-8/10 with walking  Pain location: Lt buttocks; outer hip/calf and top of Lt ankle  Pain description: fierce; ache  Aggravating factors: standing; walking Relieving factors: lying on back   PRECAUTIONS: None  WEIGHT BEARING RESTRICTIONS: No  FALLS:  Has patient fallen in last 6 months? No  LIVING ENVIRONMENT: Lives with: lives with their family and lives with their spouse Lives in: House/apartment Stairs: No Has following equipment at home: [place] bench, and Grab bars  OCCUPATION: retired from Designer, jewellery  development in 2014; now active yard work, remodeling, zero gravity work station for computer work; lazy boy recline   PLOF: Independent  PATIENT GOALS: get rid of the pain   NEXT MD VISIT: to schedule   OBJECTIVE: (Measures in this section from initial evaluation unless otherwise noted)   DIAGNOSTIC FINDINGS:  Xray 05/29/22: lumbar spine - Mild posterior L5-S1 disc space narrowing, Mild L5-S1 facet joint arthropathy, Mild L5-S1 degenerative disc and facet disease.  SENSATION: Numbness and tingling Lt LB  to buttock, lateral thigh to top of foot   POSTURE:  rounded shoulders, forward head, decreased lumbar lordosis, and flexed trunk   PALPATION: Tightness with PA and Lt lateral mobs through lower lumbar spine; muscular tightness in the Lt posterior   LUMBAR ROM: limited throughout - patient did not tolerate standing for lumbar ROM testing   LOWER EXTREMITY STRENGTH:     Active  Right eval Left eval  Hip flexion 5/5 4+/5  Hip extension 5/5 5-/5  Hip abduction 5/5 5-/5  Hip adduction    Hip internal rotation    Hip external rotation    Knee flexion    Knee extension    Ankle dorsiflexion 5/5 3+/5  Ankle plantarflexion    Great toe extension  5/5 3+/5       (Blank rows = not tested)  LUMBAR SPECIAL TESTS:  Straight leg raise test: Negative and Slump test: not tested  GAIT: Distance walked: 40 feet Assistive device utilized: Crutches Level of assistance: Independent Comments: abnormal gait pattern with crutches - LE's in ER Lt > Rt; decreased weight bearing Lt LE   [facility] Adult PT Treatment:                                                DATE: 06/11/22 Therapeutic Exercise: Nustep L4 x 5 min LEs only Sitting Hamstring stretch x30 sec Swiss ball lumbar flexion x10 Supine Hamstring stretch with strap x 30 sec Piriformis stretch 2x30 sec LTR 2x30 sec Ab set 10x5 sec feet on pball Ab set with knee flex/ext on pball 2x5 Sciatic nerve glide x10 Ab set with clamshell green TB 2x10x5 sec Standing Trialed gastroc stretch in standing x30 sec but pt could feel increased lumbar tension and nerve tension "L" stretch at chair 3x10 sec   [facility] Adult PT Treatment:                                                DATE: 06/09/22 Therapeutic Exercise: Nustep L4 x 5 min LEs only Prone hip ext 2x10 Prone press up 2 sec hold x5 Supine piriformis stretch 2x 30 sec Supine sciatic nerve glide x10 Supine ab set 10x5 sec Supine ab set with clamshell green TB 2x10x5 sec Supine ab set +  SLR x10  LTR 2x30 sec Seated hamstring stretch 2x30 sec Gastroc stretch 2x30 sec Manual Therapy: Deep tissue work through the [employer] lumbar musculature, Lt Solicitor and UPAs lumbar spine  [facility] Adult PT Treatment:  DATE: 06/04/22 Therapeutic Exercise: Prone press up 2 sec hold x 10; x 5  Manual Therapy: Deep tissue work through the Lt lumbar musculature. Skilled palpation to assess response to manual work and DN  Trigger Point Dry-Needling  Treatment instructions: Expect mild to moderate muscle soreness. S/S of pneumothorax if dry needled over a lung field, and to seek immediate medical attention should they occur. Patient verbalized understanding of these instructions and education.  Patient Consent Given: Yes Education handout provided: Yes Muscles treated: Lt QL; lumbar paraspinals  Electrical stimulation performed: No Parameters: N/A Treatment response/outcome: decreased palpable tightness   Neuromuscular re-ed: Education re positions for sitting and lying down  Self Care: Education re- extension program - to avoid sitting and work on neurtral spine position   PATIENT EDUCATION:  Education details: POC; HEP  Person educated: Patient Education method: Consulting civil engineer, Media planner, Corporate treasurer cues, Verbal cues, and Handouts Education comprehension: verbalized understanding, returned demonstration, verbal cues required, tactile cues required, and needs further education  HOME EXERCISE PROGRAM: Access Code: [email or web address] URL: [email or web address] Date: 06/09/2022 Prepared by: Estill Bamberg April Thurnell Garbe  Exercises - Prone Press Up  - 2 x daily - 7 x weekly - 1 sets - 10 reps - 2-3 sec  hold - Standing Lumbar Extension  - 2 x daily - 7 x weekly - 1 sets - 2-3 reps - 2-3 sec  hold - Supine Transversus Abdominis Bracing with Pelvic Floor Contraction  - 2 x daily - 7 x weekly - 1 sets - 10 reps - 10sec  hold - Prone Hip  Extension  - 1 x daily - 7 x weekly - 2 sets - 10 reps - Supine Sciatic Nerve Glide  - 1 x daily - 7 x weekly - 2 sets - 5 reps - Supine Piriformis Stretch with Leg Straight  - 1 x daily - 7 x weekly - 2 sets - 30 sec hold  ASSESSMENT:  CLINICAL IMPRESSION: Pt reports difficulty tolerating extension based program -- could feel his nerve getting squeezed. Session focused on flexion based program within pt's tolerance. Improved with double knee to chest. Continued core and hip strengthening. Progressed as able. Increased tightness and pain when attempting gastroc stretching in standing this session   OBJECTIVE IMPAIRMENTS: Abnormal gait, decreased activity tolerance, decreased balance, decreased endurance, decreased mobility, difficulty walking, decreased ROM, decreased strength, increased fascial restrictions, impaired sensation, postural dysfunction, and pain.   ACTIVITY LIMITATIONS: carrying, lifting, bending, sitting, standing, squatting, transfers, bathing, toileting, dressing, and locomotion level  PARTICIPATION LIMITATIONS: meal prep, cleaning, community activity, yard work, sitting, walking  PERSONAL FACTORS: Fitness and Time since onset of injury/illness/exacerbation are also affecting patient's functional outcome.   REHAB POTENTIAL: Good  CLINICAL DECISION MAKING: Stable/uncomplicated  EVALUATION COMPLEXITY: Low   GOALS: Goals reviewed with patient? Yes  SHORT TERM GOALS: Target date: 07/02/2022    Independent in initial HEP  Baseline: Goal status: INITIAL  2.  Increase Lt LE strength by 1/2 to 1 muscle grade  Baseline:  Goal status: INITIAL    LONG TERM GOALS: Target date: 07/30/2022    Decrease pain by 75-100% allowing patient to return to normal functional activities  Baseline:  Goal status: INITIAL  2.  Gait for community distances without assistive device and normal gait pattern  Baseline:  Goal status: INITIAL  3.  5/5 strength Lt LE  Baseline:  Goal  status: INITIAL  4.  Independent in HEP including aquatic program as indicated  Baseline:  Goal status: INITIAL  5.  Improve functional limitation score to 40  Baseline:  Goal status: INITIAL    PLAN:  PT FREQUENCY: 2x/week  PT DURATION: 8 weeks  PLANNED INTERVENTIONS: Therapeutic exercises, Therapeutic activity, Neuromuscular re-education, Balance training, Gait training, Patient/Family education, Self Care, Joint mobilization, Aquatic Therapy, Dry Needling, Electrical stimulation, Spinal mobilization, Cryotherapy, Moist heat, Taping, Traction, Ultrasound, Ionotophoresis '4mg'$ /ml Dexamethasone, Manual therapy, and Re-evaluation.  PLAN FOR NEXT SESSION: review and progress HEP; continue with back care and body mechanics education; manual work, DN, modalities as indicated    Cadee Agro April Ma L Devanshi Califf, PT 06/11/2022, 9:22 AM

## 2022-06-14 ENCOUNTER — Ambulatory Visit
Admission: EM | Admit: 2022-06-14 | Discharge: 2022-06-14 | Disposition: A | Discharge status: Home / Self Care | Payer: Medicare Other | Attending: Physician Assistant | Admitting: Physician Assistant

## 2022-06-14 ENCOUNTER — Encounter: Payer: Self-pay | Admitting: Emergency Medicine

## 2022-06-14 DIAGNOSIS — M5442 Lumbago with sciatica, left side: Secondary | ICD-10-CM

## 2022-06-14 MED ORDER — TIZANIDINE HCL 4 MG PO TABS: 4.0000 mg | ORAL_TABLET | Freq: Three times a day (TID) | ORAL | 0 refills | Status: DC | PRN

## 2022-06-14 MED ORDER — HYDROCODONE-ACETAMINOPHEN 5-325 MG PO TABS: 1.0000 | ORAL_TABLET | Freq: Two times a day (BID) | ORAL | 0 refills | Status: DC | PRN

## 2022-06-14 MED ORDER — PREDNISONE 20 MG PO TABS: 40.0000 mg | ORAL_TABLET | Freq: Every day | ORAL | 0 refills | Status: AC

## 2022-06-14 NOTE — Discharge Instructions (Signed)
Please follow-up with your primary care provider as scheduled this week.  If you have any worsening symptoms including worsening pain, lower extremity weakness, numbness on the inside of your legs, bowel/bladder incontinence you need to go to the emergency room.  Start prednisone 40 mg for 5 days.  Do not take NSAIDs with this medication due to risk of GI bleeding.  Use Zanaflex up to 3 times a day.  This make you sleepy so do not drive or drink alcohol with taking it.  I have called in a few more doses of hydrocodone.  As we discussed, we cannot provide any additional refills.  This will make you sleepy so do not drive or drink alcohol with it.  You ultimately probably need to see a specialist and get an MRI.  Please follow-up with your primary care to have this arranged.

## 2022-06-14 NOTE — ED Provider Notes (Signed)
[name]    CSN: [id number] Arrival date & time: 06/14/22  1304      History   Chief Complaint No chief complaint on file.   HPI Robert Hodges is a 71 y.o. male.   Patient presents today for reevaluation of intermittent back pain over the past month.  He was seen by clinic on 05/29/2022 at which point back pain was associated with pneumonia and coughing.  He was treated with prednisone and Augmentin and had improvement but not resolution of symptoms.  He was seen again on 06/01/2022 at which point he was started on hydrocodone and tizanidine.  He was seen by his primary care who extended the course of hydrocodone which provided improvement of symptoms (generally pain reduced from a 7/8 to 3).  He has since run out of this medication.  His primary care provider has established him with physical therapy and initially had some improvement but after his most recent session on Thursday he had worsening pain prompting evaluation.  He has been taking Tylenol without improvement of symptoms.  Reports that pain will increase to 10 at times particularly when he gets up to walk around or changes position.  He denies any bowel/bladder incontinence, lower extremity weakness, saddle anesthesia.  He is only able to stay in a lying position as result of the pain.  He had x-rays at initial visit that showed degenerative changes at L5/S1 but has not had MRI.  Denies history of malignancy.    Past Medical History:  Diagnosis Date   Seizure disorder [facility])     Patient Active Problem List   Diagnosis Date Noted   Acute left-sided low back pain with left-sided sciatica 06/03/2022   CAP (community acquired pneumonia) 06/03/2022   Lipoma left thigh 03/11/2021   Well adult exam 03/04/2021   Dysphagia 11/02/2016   Colon polyp 11/02/2016   Bradycardia 01/28/2016   GERD (gastroesophageal reflux disease) 10/23/2015   Secondary hyperparathyroidism ([place]) 10/01/2015   External resorption of  root of tooth 09/25/2015   Hyperlipidemia 06/20/2008   IMPAIRED FASTING GLUCOSE 03/16/2007   Epilepsy ([place]) 01/18/2007    Past Surgical History:  Procedure Laterality Date   pudendal nerve  pudendal nerve    surgery       Home Medications    Prior to Admission medications   Medication Sig Start Date End Date Taking? Authorizing Provider  carbamazepine (TEGRETOL) 200 MG tablet Take 1 tablet (200 mg total) by mouth daily. 03/05/22  Yes Luetta Nutting, DO  famotidine (PEPCID) 20 MG tablet Take 1 tablet (20 mg total) by mouth 2 (two) times daily. 03/05/22  Yes Luetta Nutting, DO  predniSONE (DELTASONE) 20 MG tablet Take 2 tablets (40 mg total) by mouth daily for 5 days. 06/14/22 06/19/22 Yes Dhaval Woo K, PA-C  tiZANidine (ZANAFLEX) 4 MG tablet Take 1 tablet (4 mg total) by mouth every 8 (eight) hours as needed for muscle spasms. 06/14/22  Yes Jodi Criscuolo, Derry Skill, PA-C  HYDROcodone-acetaminophen (NORCO/VICODIN) 5-325 MG tablet Take 1 tablet by mouth 2 (two) times daily as needed for up to 5 days. 06/14/22 06/19/22  Surena Welge, Derry Skill, PA-C  metroNIDAZOLE (METROGEL) 0.75 % gel Apply topically 2 (two) times daily. 03/05/22   Luetta Nutting, DO    Family History Family History  Problem Relation Age of Onset   Heart failure Mother     Social History Social History   Tobacco Use   Smoking status: Former   Smokeless tobacco: Never  Media planner  Vaping Use: Never used  Substance Use Topics   Alcohol use: Yes    Comment: occassional   Drug use: Yes    Types: Other-see comments    Comment: cbd gummies     Allergies   Patient has no known allergies.   Review of Systems Review of Systems  Constitutional:  Positive for activity change. Negative for appetite change, fatigue and fever.  Gastrointestinal:  Negative for abdominal pain, diarrhea, nausea and vomiting.  Musculoskeletal:  Positive for arthralgias and back pain. Negative for myalgias.  Neurological:  Positive for numbness.  Negative for weakness.     Physical Exam Triage Vital Signs ED Triage Vitals  Enc Vitals Group     BP 06/14/22 1342 139/82     Pulse Rate 06/14/22 1342 65     Resp 06/14/22 1342 18     Temp 06/14/22 1342 97.7 F (36.5 C)     Temp Source 06/14/22 1342 Oral     SpO2 06/14/22 1342 98 %     Weight --      Height --      Head Circumference --      Peak Flow --      Pain Score 06/14/22 1340 6     Pain Loc --      Pain Edu? --      Excl. in [place]? --    No data found.  Updated Vital Signs BP 139/82 (BP Location: Left Arm)   Pulse 65   Temp 97.7 F (36.5 C) (Oral)   Resp 18   SpO2 98%   Visual Acuity Right Eye Distance:   Left Eye Distance:   Bilateral Distance:    Right Eye Near:   Left Eye Near:    Bilateral Near:     Physical Exam Vitals reviewed.  Constitutional:      General: He is awake.     Appearance: Normal appearance. He is well-developed. He is not ill-appearing.     Comments: Very pleasant male appears stated age in no acute distress laying on exam room table  HENT:     Head: Normocephalic and atraumatic.  Cardiovascular:     Rate and Rhythm: Normal rate and regular rhythm.     Heart sounds: Normal heart sounds, S1 normal and S2 normal. No murmur heard. Pulmonary:     Effort: Pulmonary effort is normal.     Breath sounds: Normal breath sounds. No stridor. No wheezing, rhonchi or rales.     Comments: Clear to auscultation bilaterally  Abdominal:     General: Bowel sounds are normal.     Palpations: Abdomen is soft.     Tenderness: There is no abdominal tenderness.  Musculoskeletal:     Cervical back: No tenderness or bony tenderness.     Thoracic back: No tenderness or bony tenderness.     Lumbar back: Tenderness present. No spasms or bony tenderness.     Comments: Back: No pain percussion of vertebrae.  No point tenderness on exam.  Positive straight leg raise on the left.  Neurological:     Mental Status: He is alert.  Psychiatric:         Behavior: Behavior is cooperative.      UC Treatments / Results  Labs (all labs ordered are listed, but only abnormal results are displayed) Labs Reviewed - No data to display  EKG   Radiology No results found.  Procedures Procedures (including critical care time)  Medications Ordered in UC Medications - No  data to display  Initial Impression / Assessment and Plan / UC Course  I have reviewed the triage vital signs and the nursing notes.  Pertinent labs & imaging results that were available during my care of the patient were reviewed by me and considered in my medical decision making (see chart for details).     Patient is well-appearing, afebrile, nontoxic, nontachycardic.  No alarm symptoms that warrant emergent evaluation or imaging.  He denies any recent trauma and has no bony tenderness to warrant reevaluation with plain films.  We did discuss that ultimately he will likely need an MRI but we cannot arrange this in urgent care.  Discussed that we typically do not prescribe ongoing courses of hydrocodone, however, given severity of his pain at this facility that has helped he was provided 10 doses with instruction to limit use of this medication as possible.  Reviewed [facility] controlled substance database shows no inappropriate refills.  He was started on prednisone 40 mg for 5 days.  He has taken this medication safely in the past.  Discussed that he is not to take NSAIDs with this medication due to risk of GI bleeding.  He was also started on tizanidine.  Discussed this can be sedating and he should not drive or drink alcohol with taking it.  Discussed that he is to rest and drink plenty of fluid.  Offered lidocaine patches but he reports he has tried these in the past and they have been ineffective.  Discussed that if he has any worsening or changing symptoms he needs to be seen immediately.  He has follow-up with his primary care on Thursday of next week, strong encouraged  to keep this appointment.  Strict return precaution given to which he expressed understanding.   Final Clinical Impressions(s) / UC Diagnoses   Final diagnoses:  Acute left-sided low back pain with left-sided sciatica     Discharge Instructions      Please follow-up with your primary care provider as scheduled this week.  If you have any worsening symptoms including worsening pain, lower extremity weakness, numbness on the inside of your legs, bowel/bladder incontinence you need to go to the emergency room.  Start prednisone 40 mg for 5 days.  Do not take NSAIDs with this medication due to risk of GI bleeding.  Use Zanaflex up to 3 times a day.  This make you sleepy so do not drive or drink alcohol with taking it.  I have called in a few more doses of hydrocodone.  As we discussed, we cannot provide any additional refills.  This will make you sleepy so do not drive or drink alcohol with it.  You ultimately probably need to see a specialist and get an MRI.  Please follow-up with your primary care to have this arranged.     ED Prescriptions     Medication Sig Dispense Auth. Provider   HYDROcodone-acetaminophen (NORCO/VICODIN) 5-325 MG tablet Take 1 tablet by mouth 2 (two) times daily as needed for up to 5 days. 10 tablet Zula Hovsepian K, PA-C   predniSONE (DELTASONE) 20 MG tablet Take 2 tablets (40 mg total) by mouth daily for 5 days. 10 tablet Latitia Housewright K, PA-C   tiZANidine (ZANAFLEX) 4 MG tablet Take 1 tablet (4 mg total) by mouth every 8 (eight) hours as needed for muscle spasms. 15 tablet Maudean Hoffmann K, PA-C      I have reviewed the PDMP during this encounter.   Ladashia Demarinis, Derry Skill, PA-C  06/14/22 1453  

## 2022-06-14 NOTE — ED Triage Notes (Signed)
Patient presents to Urgent Care with complaints of back pain since 4 days ago. Has been laying in bed for 4 days. Was able to take a shower today. Patient reports running out of pain medication (Hydrocodone-Tylenol. Has been taking Tylenol with no relief. Pain with movement. Laying supine does help.

## 2022-06-16 ENCOUNTER — Ambulatory Visit: Payer: Medicare Other | Admitting: Rehabilitative and Restorative Service Providers"

## 2022-06-18 ENCOUNTER — Encounter: Payer: Self-pay | Admitting: Family Medicine

## 2022-06-18 ENCOUNTER — Ambulatory Visit: Payer: Medicare Other | Admitting: Rehabilitative and Restorative Service Providers"

## 2022-06-18 ENCOUNTER — Ambulatory Visit: Payer: Medicare Other | Admitting: Family Medicine

## 2022-06-18 VITALS — BP 171/83 | HR 69 | Ht 71.0 in | Wt 183.0 lb

## 2022-06-18 DIAGNOSIS — M5416 Radiculopathy, lumbar region: Secondary | ICD-10-CM | POA: Diagnosis not present

## 2022-06-18 DIAGNOSIS — M5442 Lumbago with sciatica, left side: Secondary | ICD-10-CM

## 2022-06-18 MED ORDER — HYDROCODONE-ACETAMINOPHEN 5-325 MG PO TABS: 1.0000 | ORAL_TABLET | Freq: Two times a day (BID) | ORAL | 0 refills | Status: DC | PRN

## 2022-06-18 NOTE — Progress Notes (Signed)
Robert Hodges - 72 y.o. male MRN [id number]  Date of birth: 1951/03/02  Subjective Chief Complaint  Patient presents with   Follow-up    HPI Robert Hodges is a 72 year old male here today for follow-up of back pain.  He was seen for this initially about 3-1/2 weeks ago.  X-ray of the lumbar spine did show degenerative disc disease at L5-S1 and facet disease.  He has been using muscle relaxer and narcotic pain medication.  He has completed a couple courses of prednisone as well.  Refer to physical therapy and had a couple sessions.  The first couple sessions he did pretty well however with his recent session he has had significantly increased pain with some weakness and increased numbness and tingling into the left leg.  Seen in urgent care on 06/14/2022.  Tizanidine and Norco renewed an additional course of prednisone added.  He denies any perineal anesthesia, bowel or bladder dysfunction.  ROS:  A comprehensive ROS was completed and negative except as noted per HPI  No Known Allergies  Past Medical History:  Diagnosis Date   Seizure disorder [facility])     Past Surgical History:  Procedure Laterality Date   pudendal nerve  pudendal nerve    surgery    Social History   Socioeconomic History   Marital status: Married    Spouse name: Not on file   Number of children: Not on file   Years of education: Not on file   Highest education level: Not on file  Occupational History   Not on file  Tobacco Use   Smoking status: Former   Smokeless tobacco: Never  Vaping Use   Vaping Use: Never used  Substance and Sexual Activity   Alcohol use: Yes    Comment: occassional   Drug use: Yes    Types: Other-see comments    Comment: cbd gummies   Sexual activity: Not Currently    Partners: Female  Other Topics Concern   Not on file  Social History Narrative   Not on file   Social Determinants of Health   Financial Resource Strain: Low Risk  (02/22/2021)   Overall Financial Resource Strain  (CARDIA)    Difficulty of Paying Living Expenses: Not hard at all  Food Insecurity: No Food Insecurity (02/22/2021)   Hunger Vital Sign    Worried About Running Out of Food in the Last Year: Never true    Ran Out of Food in the Last Year: Never true  Transportation Needs: No Transportation Needs (02/22/2021)   PRAPARE - Hydrologist (Medical): No    Lack of Transportation (Non-Medical): No  Physical Activity: Sufficiently Active (02/22/2021)   Exercise Vital Sign    Days of Exercise per Week: 5 days    Minutes of Exercise per Session: 30 min  Stress: No Stress Concern Present (02/22/2021)   [place]    Feeling of Stress : Not at all  Social Connections: Moderately Integrated (02/22/2021)   Social Connection and Isolation Panel [NHANES]    Frequency of Communication with Friends and Family: Once a week    Frequency of Social Gatherings with Friends and Family: Never    Attends Religious Services: More than 4 times per year    Active Member of [employer] or Organizations: Yes    Attends Archivist Meetings: 1 to 4 times per year    Marital Status: Married    Family History  Problem  Relation Age of Onset   Heart failure Mother     Health Maintenance  Topic Date Due   COVID-19 Vaccine (1) 07/16/2022 (Originally 01/06/1956)   Zoster Vaccines- Shingrix (1 of 2) 07/16/2022 (Originally 01/07/1968)   Medicare Annual Wellness (AWV)  07/19/2022 (Originally 02/22/2022)   Pneumonia Vaccine 38+ Years old (1 - PCV) 09/06/2022 (Originally 01/06/2014)   INFLUENZA VACCINE  09/13/2022 (Originally 01/13/2022)   COLONOSCOPY (Pts 45-14yr Insurance coverage will need to be confirmed)  03/06/2023 (Originally 11/05/2021)   DTaP/Tdap/Td (3 - Td or Tdap) 01/13/2024   Hepatitis C Screening  Completed   HPV VACCINES  Aged Out      ----------------------------------------------------------------------------------------------------------------------------------------------------------------------------------------------------------------- Physical Exam BP (!) 171/83 (BP Location: Right Arm, Patient Position: Sitting, Cuff Size: Normal)   Pulse 69   Ht '5\' 11"'$  (1.803 m)   Wt 183 lb (83 kg)   SpO2 97%   BMI 25.52 kg/m   Physical Exam Constitutional:      Appearance: Normal appearance.  HENT:     Head: Normocephalic and atraumatic.  Eyes:     General: No scleral icterus. Cardiovascular:     Rate and Rhythm: Normal rate and regular rhythm.  Pulmonary:     Effort: Pulmonary effort is normal.     Breath sounds: Normal breath sounds.  Musculoskeletal:     Cervical back: Neck supple.     Comments: Increased pain with left straight leg raise.  4/5 strength with hip flexion and lower leg extension on the left compared to the right.  Neurological:     Mental Status: He is alert.  Psychiatric:        Mood and Affect: Mood normal.        Behavior: Behavior normal.     ------------------------------------------------------------------------------------------------------------------------------------------------------------------------------------------------------------------- Assessment and Plan  Acute left-sided low back pain with left-sided sciatica He is having worsening pain with increased weakness and numbness into the left leg.  Symptoms have been exacerbated by physical therapy.  Limited-ordered MRI for further evaluation and interventional planning.  I did renew Norco for now help with pain management.   Meds ordered this encounter  Medications   HYDROcodone-acetaminophen (NORCO/VICODIN) 5-325 MG tablet    Sig: Take 1 tablet by mouth 2 (two) times daily as needed for up to 15 days.    Dispense:  30 tablet    Refill:  0    No follow-ups on file.    This visit occurred during the SARS-CoV-2  public health emergency.  Safety protocols were in place, including screening questions prior to the visit, additional usage of staff PPE, and extensive cleaning of exam room while observing appropriate contact time as indicated for disinfecting solutions.

## 2022-06-18 NOTE — Assessment & Plan Note (Signed)
He is having worsening pain with increased weakness and numbness into the left leg.  Symptoms have been exacerbated by physical therapy.  Limited-ordered MRI for further evaluation and interventional planning.  I did renew Norco for now help with pain management.

## 2022-06-19 DIAGNOSIS — R200 Anesthesia of skin: Secondary | ICD-10-CM | POA: Diagnosis not present

## 2022-06-19 DIAGNOSIS — M47816 Spondylosis without myelopathy or radiculopathy, lumbar region: Secondary | ICD-10-CM | POA: Diagnosis not present

## 2022-06-19 DIAGNOSIS — R102 Pelvic and perineal pain: Secondary | ICD-10-CM | POA: Diagnosis not present

## 2022-06-19 DIAGNOSIS — M25552 Pain in left hip: Secondary | ICD-10-CM | POA: Diagnosis not present

## 2022-06-19 DIAGNOSIS — R030 Elevated blood-pressure reading, without diagnosis of hypertension: Secondary | ICD-10-CM | POA: Diagnosis not present

## 2022-06-19 DIAGNOSIS — M5459 Other low back pain: Secondary | ICD-10-CM | POA: Diagnosis not present

## 2022-06-19 DIAGNOSIS — M4316 Spondylolisthesis, lumbar region: Secondary | ICD-10-CM | POA: Diagnosis not present

## 2022-06-19 DIAGNOSIS — M545 Low back pain, unspecified: Secondary | ICD-10-CM | POA: Diagnosis not present

## 2022-06-19 DIAGNOSIS — I10 Essential (primary) hypertension: Secondary | ICD-10-CM | POA: Diagnosis not present

## 2022-06-19 DIAGNOSIS — Z79899 Other long term (current) drug therapy: Secondary | ICD-10-CM | POA: Diagnosis not present

## 2022-06-19 DIAGNOSIS — K219 Gastro-esophageal reflux disease without esophagitis: Secondary | ICD-10-CM | POA: Diagnosis not present

## 2022-06-20 DIAGNOSIS — R030 Elevated blood-pressure reading, without diagnosis of hypertension: Secondary | ICD-10-CM | POA: Diagnosis not present

## 2022-06-20 DIAGNOSIS — R102 Pelvic and perineal pain: Secondary | ICD-10-CM | POA: Diagnosis not present

## 2022-06-20 DIAGNOSIS — K219 Gastro-esophageal reflux disease without esophagitis: Secondary | ICD-10-CM | POA: Diagnosis not present

## 2022-06-20 DIAGNOSIS — M4316 Spondylolisthesis, lumbar region: Secondary | ICD-10-CM | POA: Diagnosis not present

## 2022-06-20 DIAGNOSIS — Z79899 Other long term (current) drug therapy: Secondary | ICD-10-CM | POA: Diagnosis not present

## 2022-06-20 DIAGNOSIS — M47816 Spondylosis without myelopathy or radiculopathy, lumbar region: Secondary | ICD-10-CM | POA: Diagnosis not present

## 2022-06-20 DIAGNOSIS — M545 Low back pain, unspecified: Secondary | ICD-10-CM | POA: Diagnosis not present

## 2022-06-20 DIAGNOSIS — R200 Anesthesia of skin: Secondary | ICD-10-CM | POA: Diagnosis not present

## 2022-06-20 DIAGNOSIS — M25552 Pain in left hip: Secondary | ICD-10-CM | POA: Diagnosis not present

## 2022-06-22 ENCOUNTER — Other Ambulatory Visit: Payer: Self-pay | Admitting: Family Medicine

## 2022-06-22 ENCOUNTER — Telehealth: Payer: Self-pay

## 2022-06-22 MED ORDER — GABAPENTIN 300 MG PO CAPS: ORAL_CAPSULE | ORAL | 3 refills | Status: DC

## 2022-06-22 NOTE — Telephone Encounter (Signed)
MRI ordered, pending insurane approval.  Gabapentin ordered.

## 2022-06-23 ENCOUNTER — Telehealth: Payer: Self-pay

## 2022-06-23 ENCOUNTER — Encounter: Payer: Medicare Other | Admitting: Rehabilitative and Restorative Service Providers"

## 2022-06-23 NOTE — Telephone Encounter (Signed)
Patient lvm concerning MRI.   Per last note, MRI was awaiting insurance authorization.   Imaging Auth: Please contact Robert Hodges with an update ASAP. Thank you.

## 2022-06-25 ENCOUNTER — Encounter: Payer: Medicare Other | Admitting: Rehabilitative and Restorative Service Providers"

## 2022-06-27 ENCOUNTER — Ambulatory Visit (INDEPENDENT_AMBULATORY_CARE_PROVIDER_SITE_OTHER): Payer: Medicare Other

## 2022-06-27 DIAGNOSIS — M5416 Radiculopathy, lumbar region: Secondary | ICD-10-CM | POA: Diagnosis not present

## 2022-06-27 DIAGNOSIS — R200 Anesthesia of skin: Secondary | ICD-10-CM | POA: Diagnosis not present

## 2022-06-27 DIAGNOSIS — M545 Low back pain, unspecified: Secondary | ICD-10-CM | POA: Diagnosis not present

## 2022-06-27 DIAGNOSIS — M4807 Spinal stenosis, lumbosacral region: Secondary | ICD-10-CM | POA: Diagnosis not present

## 2022-06-29 ENCOUNTER — Other Ambulatory Visit: Payer: Self-pay | Admitting: Family Medicine

## 2022-06-29 DIAGNOSIS — M48061 Spinal stenosis, lumbar region without neurogenic claudication: Secondary | ICD-10-CM

## 2022-06-29 DIAGNOSIS — M5416 Radiculopathy, lumbar region: Secondary | ICD-10-CM

## 2022-06-30 ENCOUNTER — Telehealth: Payer: Self-pay

## 2022-06-30 MED ORDER — HYDROCODONE-ACETAMINOPHEN 5-325 MG PO TABS: 1.0000 | ORAL_TABLET | Freq: Two times a day (BID) | ORAL | 0 refills | Status: AC | PRN

## 2022-06-30 NOTE — Telephone Encounter (Signed)
Completed.

## 2022-07-02 ENCOUNTER — Telehealth: Payer: Self-pay

## 2022-07-02 NOTE — Telephone Encounter (Signed)
Pt lvm stating [employer] advised he can have a wheelchair with Provider order.   Sent to Dr. Zigmund Daniel for consideration.

## 2022-07-06 NOTE — Telephone Encounter (Signed)
Does he feel like he needs a wheelchair.  I would like for him to try and get up and move if possible.  Does have have an appt with neurosurgery?  CM

## 2022-07-09 NOTE — Telephone Encounter (Signed)
Spoke to patient on 07/09/22. States he is crawling to the restroom and has problems with constipation.   Advised patient that he can add daily Miralax. Also provided information concerning constipation and pain medications. Informed inactivity also contributes to his symptoms. Patient refuses to ambulate due to symptoms such as leg tingling and the beginnings of back pain when he attempts to walk.   He would like to either rent or purchase a wheelchair for help getting to the restroom.   Neurosurgery is currently reviewing his chart prior to scheduling.

## 2022-07-13 DIAGNOSIS — M5126 Other intervertebral disc displacement, lumbar region: Secondary | ICD-10-CM | POA: Diagnosis not present

## 2022-07-14 NOTE — Telephone Encounter (Signed)
Looks like he was just seen by neurosurgery, planning for surgery Friday.  Let's hold off on DME equipment at this time to see if he has additional needs after surgery.   CM

## 2022-07-16 ENCOUNTER — Telehealth: Payer: Self-pay | Admitting: Neurology

## 2022-07-16 NOTE — Telephone Encounter (Signed)
Please have him check with the pharmacy to see if they have the prescription for [facility].

## 2022-07-16 NOTE — Telephone Encounter (Signed)
Patient called and LVM requesting a refill of hydrocodone until he has surgery. He was seen by Dr. Aretha Parrot and is scheduled on 07/24/2022. Please advise (it does look like a prescription for hydrocodone was sent from [facility] on 07/13/2022).

## 2022-07-21 ENCOUNTER — Telehealth: Payer: Self-pay

## 2022-07-21 ENCOUNTER — Other Ambulatory Visit: Payer: Self-pay | Admitting: Family Medicine

## 2022-07-21 MED ORDER — HYDROCODONE-ACETAMINOPHEN 5-325 MG PO TABS: 1.0000 | ORAL_TABLET | Freq: Two times a day (BID) | ORAL | 0 refills | Status: DC | PRN

## 2022-07-21 NOTE — Telephone Encounter (Signed)
Pt lvm requesting Hydrocodone refill until surgery on Friday.  He's also taking Tylenol and would like to know if it's ok to continue past 10 days of continuous usage. Please advise.

## 2022-07-21 NOTE — Telephone Encounter (Signed)
Prescription renewed.

## 2022-07-24 DIAGNOSIS — M5126 Other intervertebral disc displacement, lumbar region: Secondary | ICD-10-CM | POA: Diagnosis not present

## 2022-07-28 DIAGNOSIS — K080 Exfoliation of teeth due to systemic causes: Secondary | ICD-10-CM | POA: Diagnosis not present

## 2022-08-11 DIAGNOSIS — K080 Exfoliation of teeth due to systemic causes: Secondary | ICD-10-CM | POA: Diagnosis not present

## 2022-08-17 DIAGNOSIS — K080 Exfoliation of teeth due to systemic causes: Secondary | ICD-10-CM | POA: Diagnosis not present

## 2022-10-22 ENCOUNTER — Encounter: Payer: Self-pay | Admitting: Family Medicine

## 2022-10-22 ENCOUNTER — Ambulatory Visit (INDEPENDENT_AMBULATORY_CARE_PROVIDER_SITE_OTHER): Payer: Medicare Other | Admitting: Family Medicine

## 2022-10-22 VITALS — BP 144/81 | HR 70 | Ht 71.0 in | Wt 192.0 lb

## 2022-10-22 DIAGNOSIS — M25572 Pain in left ankle and joints of left foot: Secondary | ICD-10-CM | POA: Diagnosis not present

## 2022-10-22 NOTE — Progress Notes (Signed)
Robert Hodges - 72 y.o. male MRN [id number]  Date of birth: [birth year]  Subjective Chief Complaint  Patient presents with   Ankle Pain    HPI Robert Hodges is a 72 y.o. male here today with complaint of left ankle pain.  Symptoms began a few days ago.  He does not recall any injury to the left ankle.  He has not noted any swelling or bruising.  Has not tried anything so far for management.  He has more pain with walking.  He does use to wear well supportive shoes.  ROS:  A comprehensive ROS was completed and negative except as noted per HPI  No Known Allergies  Past Medical History:  Diagnosis Date   Seizure disorder [facility])     Past Surgical History:  Procedure Laterality Date   pudendal nerve  pudendal nerve    surgery    Social History   Socioeconomic History   Marital status: Married    Spouse name: Not on file   Number of children: Not on file   Years of education: Not on file   Highest education level: Not on file  Occupational History   Not on file  Tobacco Use   Smoking status: Former   Smokeless tobacco: Never  Vaping Use   Vaping Use: Never used  Substance and Sexual Activity   Alcohol use: Yes    Comment: occassional   Drug use: Yes    Types: Other-see comments    Comment: cbd gummies   Sexual activity: Not Currently    Partners: Female  Other Topics Concern   Not on file  Social History Narrative   Not on file   Social Determinants of Health   Financial Resource Strain: Low Risk  (02/22/2021)   Overall Financial Resource Strain (CARDIA)    Difficulty of Paying Living Expenses: Not hard at all  Food Insecurity: No Food Insecurity (02/22/2021)   Hunger Vital Sign    Worried About Running Out of Food in the Last Year: Never true    Ran Out of Food in the Last Year: Never true  Transportation Needs: No Transportation Needs (02/22/2021)   PRAPARE - Administrator, Civil Service (Medical): No    Lack of Transportation (Non-Medical): No   Physical Activity: Sufficiently Active (02/22/2021)   Exercise Vital Sign    Days of Exercise per Week: 5 days    Minutes of Exercise per Session: 30 min  Stress: No Stress Concern Present (02/22/2021)   [employer] of Occupational Health - Occupational Stress Questionnaire    Feeling of Stress : Not at all  Social Connections: Moderately Integrated (02/22/2021)   Social Connection and Isolation Panel [NHANES]    Frequency of Communication with Friends and Family: Once a week    Frequency of Social Gatherings with Friends and Family: Never    Attends Religious Services: More than 4 times per year    Active Member of Clubs or Organizations: Yes    Attends Banker Meetings: 1 to 4 times per year    Marital Status: Married    Family History  Problem Relation Age of Onset   Heart failure Mother     Health Maintenance  Topic Date Due   Medicare Annual Wellness (AWV)  11/22/2022 (Originally 02/22/2022)   COLONOSCOPY (Pts 45-47yrs Insurance coverage will need to be confirmed)  03/06/2023 (Originally 11/05/2021)   Zoster Vaccines- Shingrix (1 of 2) 03/24/2023 (Originally 01/07/1970)   COVID-19 Vaccine (1) 04/09/2023 (  Originally 01/06/1956)   Pneumonia Vaccine 57+ Years old (1 of 1 - PCV) 03/23/2024 (Originally 01/06/2014)   INFLUENZA VACCINE  01/14/2023   DTaP/Tdap/Td (3 - Td or Tdap) 01/13/2024   Hepatitis C Screening  Completed   HPV VACCINES  Aged Out     ----------------------------------------------------------------------------------------------------------------------------------------------------------------------------------------------------------------- Physical Exam BP (!) 144/81 (BP Location: Left Arm, Patient Position: Sitting, Cuff Size: Normal)   Pulse 70   Ht 5\' 11"  (1.803 m)   Wt 192 lb (87.1 kg)   SpO2 99%   BMI 26.78 kg/m   Physical Exam Constitutional:      Appearance: Normal appearance.  HENT:     Head: Normocephalic and atraumatic.  Eyes:      General: No scleral icterus. Musculoskeletal:     Comments: No significant swelling or effusion over the left ankle.  He does have some mild tenderness along the lateral ankle.  Range of motion is fairly good.  Anterior drawer of the ankle without any significant laxity.  Neurological:     Mental Status: He is alert.     ------------------------------------------------------------------------------------------------------------------------------------------------------------------------------------------------------------------- Assessment and Plan  Left ankle pain Seems to be tendinopathy, possibly related to gait changes since having recent back surgery.  Recommend continued use of good supportive shoes.  Can try Voltaren gel applied to the ankle.  Handout given for home exercises.   No orders of the defined types were placed in this encounter.   No follow-ups on file.    This visit occurred during the SARS-CoV-2 public health emergency.  Safety protocols were in place, including screening questions prior to the visit, additional usage of staff PPE, and extensive cleaning of exam room while observing appropriate contact time as indicated for disinfecting solutions.

## 2022-10-22 NOTE — Assessment & Plan Note (Signed)
Seems to be tendinopathy, possibly related to gait changes since having recent back surgery.  Recommend continued use of good supportive shoes.  Can try Voltaren gel applied to the ankle.  Handout given for home exercises.

## 2022-10-22 NOTE — Patient Instructions (Signed)
Try voltaren gel, applied to foot/ankle 3-4x per day.   Try foot and ankle exercises.  Let me know if not getting better or if symptoms worsen.

## 2022-11-12 ENCOUNTER — Ambulatory Visit (INDEPENDENT_AMBULATORY_CARE_PROVIDER_SITE_OTHER): Payer: Medicare Other | Admitting: Family Medicine

## 2022-11-12 DIAGNOSIS — Z0000 Encounter for general adult medical examination without abnormal findings: Secondary | ICD-10-CM

## 2022-11-12 NOTE — Progress Notes (Signed)
MEDICARE ANNUAL WELLNESS VISIT  11/12/2022  Telephone Visit Disclaimer This Medicare AWV was conducted by telephone due to national recommendations for restrictions regarding the COVID-19 Pandemic (e.g. social distancing).  I verified, using two identifiers, that I am speaking with Robert Hodges or their authorized healthcare agent. I discussed the limitations, risks, security, and privacy concerns of performing an evaluation and management service by telephone and the potential availability of an in-person appointment in the future. The patient expressed understanding and agreed to proceed.  Location of Patient: Home Location of Provider (nurse):  in the office.  Subjective:    Robert Hodges is a 72 y.o. male patient of Robert Coombe, DO who had a Medicare Annual Wellness Visit today via telephone. Jahmier is Retired and lives with their family. he has 4 children. he reports that he is socially active and does interact with friends/family regularly. he is moderately physically active and enjoys bowling and going to the gun range.  Patient Care Team: Robert Coombe, DO as PCP - General (Family Medicine)     11/12/2022   11:10 AM 06/04/2022   10:21 AM 02/22/2021   11:13 AM  Advanced Directives  Does Patient Have a Medical Advance Directive? Yes No No  Type of Advance Directive Healthcare Power of Attorney    Does patient want to make changes to medical advance directive? No - Patient declined    Copy of Healthcare Power of Attorney in Chart? No - copy requested    Would patient like information on creating a medical advance directive?  No - Patient declined     Hospital Utilization Over the Past 12 Months: # of hospitalizations or ER visits: 1 # of surgeries: 1  Review of Systems    Patient reports that his overall health is unchanged compared to last year.  History obtained from chart review and the patient  Patient Reported Readings (BP, Pulse, CBG, Weight,  etc) none  Pain Assessment Pain : No/denies pain     Current Medications & Allergies (verified) Allergies as of 11/12/2022   No Known Allergies      Medication List        Accurate as of Nov 12, 2022 11:14 AM. If you have any questions, ask your nurse or doctor.          STOP taking these medications    tiZANidine 4 MG tablet Commonly known as: ZANAFLEX       TAKE these medications    carbamazepine 200 MG tablet Commonly known as: TEGRETOL Take 1 tablet (200 mg total) by mouth daily.   famotidine 20 MG tablet Commonly known as: PEPCID Take 1 tablet (20 mg total) by mouth 2 (two) times daily.   metroNIDAZOLE 0.75 % gel Commonly known as: METROGEL Apply topically 2 (two) times daily.        History (reviewed): Past Medical History:  Diagnosis Date   GERD (gastroesophageal reflux disease)    control with meds   Neuromuscular disorder (HCC) 2010   Pudendal Neuralgia   Seizure disorder [facility])    Past Surgical History:  Procedure Laterality Date   EYE SURGERY     Lasik   pudendal nerve  pudendal nerve    surgery   SPINE SURGERY  07/24/2022   Lumbar Micro-discectomy   Family History  Problem Relation Age of Onset   Heart failure Mother    Social History   Socioeconomic History   Marital status: Married    Spouse name: Pension scheme manager  Number of children: 4   Years of education: 14   Highest education level: Associate degree: occupational, Scientist, product/process development, or vocational program  Occupational History   Occupation: Retired  Tobacco Use   Smoking status: Former   Smokeless tobacco: Never  Building services engineer Use: Never used  Substance and Sexual Activity   Alcohol use: Yes    Alcohol/week: 3.0 - 4.0 standard drinks of alcohol    Types: 3 - 4 Standard drinks or equivalent per week    Comment: occassional   Drug use: Not Currently    Types: Other-see comments    Comment: cbd gummies   Sexual activity: Not Currently    Partners: Female  Other Topics  Concern   Not on file  Social History Narrative   Lives with his wife, son, daughter and grand-daughter. He has four children. He enjoys bowling, hiking, walking and going to the gun range.    Social Determinants of Health   Financial Resource Strain: Low Risk  (11/08/2022)   Overall Financial Resource Strain (CARDIA)    Difficulty of Paying Living Expenses: Not hard at all  Food Insecurity: No Food Insecurity (11/08/2022)   Hunger Vital Sign    Worried About Running Out of Food in the Last Year: Never true    Ran Out of Food in the Last Year: Never true  Transportation Needs: No Transportation Needs (11/08/2022)   PRAPARE - Administrator, Civil Service (Medical): No    Lack of Transportation (Non-Medical): No  Physical Activity: Insufficiently Active (11/08/2022)   Exercise Vital Sign    Days of Exercise per Week: 3 days    Minutes of Exercise per Session: 30 min  Stress: No Stress Concern Present (11/08/2022)   [employer] of Occupational Health - Occupational Stress Questionnaire    Feeling of Stress : Not at all  Social Connections: Moderately Integrated (11/12/2022)   Social Connection and Isolation Panel [NHANES]    Frequency of Communication with Friends and Family: Never    Frequency of Social Gatherings with Friends and Family: Once a week    Attends Religious Services: More than 4 times per year    Active Member of [employer] or Organizations: Yes    Attends Banker Meetings: More than 4 times per year    Marital Status: Married    Activities of Daily Living    11/08/2022    2:44 PM 03/01/2022   12:45 PM  In your present state of health, do you have any difficulty performing the following activities:  Hearing? 0 0  Vision? 0 0  Difficulty concentrating or making decisions? 0 0  Walking or climbing stairs? 0 0  Dressing or bathing? 0 0  Doing errands, shopping? 0 0  Preparing Food and eating ? N N  Using the Toilet? N N  In the past six  months, have you accidently leaked urine? N N  Do you have problems with loss of bowel control? N N  Managing your Medications? N N  Managing your Finances? N N  Housekeeping or managing your Housekeeping? N N    Patient Education/ Literacy How often do you need to have someone help you when you read instructions, pamphlets, or other written materials from your doctor or pharmacy?: 1 - Never What is the last grade level you completed in school?: 12th grade and technical school  Exercise Current Exercise Habits: Home exercise routine, Type of exercise: walking, Time (Minutes): 30, Frequency (Times/Week): 3, Weekly  Exercise (Minutes/Week): 90, Intensity: Moderate, Exercise limited by: None identified  Diet Patient reports consuming 2 meals a day and 0 snack(s) a day Patient reports that his primary diet is: Regular Patient reports that she does have regular access to food.   Depression Screen    11/12/2022   11:05 AM 10/22/2022    1:14 PM 03/05/2022   10:26 AM 03/04/2021    1:30 PM 02/22/2021   11:11 AM 08/12/2017   11:00 AM 02/11/2017    1:14 PM  PHQ 2/9 Scores  PHQ - 2 Score 0 0 0 0 0 0 0  PHQ- 9 Score      1 0     Fall Risk    11/12/2022   11:05 AM 11/08/2022    2:44 PM 10/22/2022    1:14 PM 03/05/2022   10:26 AM 03/01/2022   12:45 PM  Fall Risk   Falls in the past year? 0 0 0 0 0  Number falls in past yr: 0 0 0 0 0  Injury with Fall? 0 0 0 0 0  Risk for fall due to : No Fall Risks  No Fall Risks No Fall Risks   Follow up Falls evaluation completed  Falls evaluation completed Falls evaluation completed      Objective:  Robert Hodges seemed alert and oriented and he participated appropriately during our telephone visit.  Blood Pressure Weight BMI  BP Readings from Last 3 Encounters:  10/22/22 (!) 144/81  06/18/22 (!) 171/83  06/14/22 139/82   Wt Readings from Last 3 Encounters:  10/22/22 192 lb (87.1 kg)  06/18/22 183 lb (83 kg)  06/03/22 185 lb (83.9 kg)   BMI  Readings from Last 1 Encounters:  10/22/22 26.78 kg/m    *Unable to obtain current vital signs, weight, and BMI due to telephone visit type  Hearing/Vision  Jumaane did not seem to have difficulty with hearing/understanding during the telephone conversation Reports that he has not had a formal eye exam by an eye care professional within the past year Reports that he has not had a formal hearing evaluation within the past year *Unable to fully assess hearing and vision during telephone visit type  Cognitive Function:    11/12/2022   11:10 AM 02/22/2021   11:19 AM  6CIT Screen  What Year? 0 points 0 points  What month? 0 points 0 points  What time? 0 points 0 points  Count back from 20 0 points 0 points  Months in reverse 0 points 0 points  Repeat phrase 0 points 0 points  Total Score 0 points 0 points   (Normal:0-7, Significant for Dysfunction: >8)  Normal Cognitive Function Screening: Yes   Immunization & Health Maintenance Record Immunization History  Administered Date(s) Administered   Td 06/30/2010   Tdap 01/12/2014    Health Maintenance  Topic Date Due   Colonoscopy  03/06/2023 (Originally 11/05/2021)   Zoster Vaccines- Shingrix (1 of 2) 03/24/2023 (Originally 01/07/1970)   COVID-19 Vaccine (1) 04/09/2023 (Originally 01/06/1956)   Pneumonia Vaccine 78+ Years old (1 of 1 - PCV) 03/23/2024 (Originally 01/06/2014)   INFLUENZA VACCINE  01/14/2023   Medicare Annual Wellness (AWV)  11/12/2023   DTaP/Tdap/Td (3 - Td or Tdap) 01/13/2024   Hepatitis C Screening  Completed   HPV VACCINES  Aged Out       Assessment  This is a routine wellness examination for Robert Hodges.  Health Maintenance: Due or Overdue There are no preventive care reminders to  display for this patient.  Robert Hodges does not need a referral for Community Assistance: Care Management:   no Social Work:    no Prescription Assistance:  no Nutrition/Diabetes Education:  no   Plan:  Personalized  Goals  Goals Addressed               This Visit's Progress     Patient Stated (pt-stated)        Patient stated that he would like to continue to maintain his currently healthy lifestyle.       Personalized Health Maintenance & Screening Recommendations  Pneumococcal vaccine  Influenza vaccine Colorectal cancer screening Shingles vaccine  Patient declined the vaccines.  Lung Cancer Screening Recommended: no (Low Dose CT Chest recommended if Age 33-80 years, 20 pack-year currently smoking OR have quit w/in past 15 years) Hepatitis C Screening recommended: no HIV Screening recommended: no  Advanced Directives: Written information was not prepared per patient's request.  Referrals & Orders No orders of the defined types were placed in this encounter.   Follow-up Plan Follow-up with Robert Coombe, DO as planned Patient will update if he needs a new referral for colonoscopy. Medicare wellness visit in one year.  Patient will access AVS on mychart.   I have personally reviewed and noted the following in the patient's chart:   Medical and social history Use of alcohol, tobacco or illicit drugs  Current medications and supplements Functional ability and status Nutritional status Physical activity Advanced directives List of other physicians Hospitalizations, surgeries, and ER visits in previous 12 months Vitals Screenings to include cognitive, depression, and falls Referrals and appointments  In addition, I have reviewed and discussed with Robert Hodges certain preventive protocols, quality metrics, and best practice recommendations. A written personalized care plan for preventive services as well as general preventive health recommendations is available and can be mailed to the patient at his request.      Modesto Charon, RN BSN  11/12/2022

## 2022-11-12 NOTE — Patient Instructions (Addendum)
MEDICARE ANNUAL WELLNESS VISIT Health Maintenance Summary and Written Plan of Care  [name] ,  Thank you for allowing me to perform your Medicare Annual Wellness Visit and for your ongoing commitment to your health.   Health Maintenance & Immunization History Health Maintenance  Topic Date Due   Colonoscopy  03/06/2023 (Originally 11/05/2021)   Zoster Vaccines- Shingrix (1 of 2) 03/24/2023 (Originally 01/07/1970)   COVID-19 Vaccine (1) 04/09/2023 (Originally 01/06/1956)   Pneumonia Vaccine 13+ Years old (1 of 1 - PCV) 03/23/2024 (Originally 01/06/2014)   INFLUENZA VACCINE  01/14/2023   Medicare Annual Wellness (AWV)  11/12/2023   DTaP/Tdap/Td (3 - Td or Tdap) 01/13/2024   Hepatitis C Screening  Completed   HPV VACCINES  Aged Out   Immunization History  Administered Date(s) Administered   Td 06/30/2010   Tdap 01/12/2014    These are the patient goals that we discussed:  Goals Addressed               This Visit's Progress     Patient Stated (pt-stated)        Patient stated that he would like to continue to maintain his currently healthy lifestyle.         This is a list of Health Maintenance Items that are overdue or due now: Pneumococcal vaccine  Influenza vaccine Colorectal cancer screening Shingles vaccine  Patient declined the vaccines.  Orders/Referrals Placed Today: No orders of the defined types were placed in this encounter.  (Contact our referral department at [phone] if you have not spoken with someone about your referral appointment within the next 5 days)    Follow-up Plan Follow-up with Everrett Coombe, DO as planned Patient will update if he needs a new referral for colonoscopy. Medicare wellness visit in one year.  Patient will access AVS on mychart.      Health Maintenance, Male Adopting a healthy lifestyle and getting preventive care are important in promoting health and wellness. Ask your health care provider about: The right  schedule for you to have regular tests and exams. Things you can do on your own to prevent diseases and keep yourself healthy. What should I know about diet, weight, and exercise? Eat a healthy diet  Eat a diet that includes plenty of vegetables, fruits, low-fat dairy products, and lean protein. Do not eat a lot of foods that are high in solid fats, added sugars, or sodium. Maintain a healthy weight Body mass index (BMI) is a measurement that can be used to identify possible weight problems. It estimates body fat based on height and weight. Your health care provider can help determine your BMI and help you achieve or maintain a healthy weight. Get regular exercise Get regular exercise. This is one of the most important things you can do for your health. Most adults should: Exercise for at least 150 minutes each week. The exercise should increase your heart rate and make you sweat (moderate-intensity exercise). Do strengthening exercises at least twice a week. This is in addition to the moderate-intensity exercise. Spend less time sitting. Even light physical activity can be beneficial. Watch cholesterol and blood lipids Have your blood tested for lipids and cholesterol at 74 years of age, then have this test every 5 years. You may need to have your cholesterol levels checked more often if: Your lipid or cholesterol levels are high. You are older than 74 years of age. You are at high risk for heart disease. What should I know about cancer screening?  Many types of cancers can be detected early and may often be prevented. Depending on your health history and family history, you may need to have cancer screening at various ages. This may include screening for: Colorectal cancer. Prostate cancer. Skin cancer. Lung cancer. What should I know about heart disease, diabetes, and high blood pressure? Blood pressure and heart disease High blood pressure causes heart disease and increases the risk of  stroke. This is more likely to develop in people who have high blood pressure readings or are overweight. Talk with your health care provider about your target blood pressure readings. Have your blood pressure checked: Every 3-5 years if you are 74-74 years of age. Every year if you are 74 years old or older. If you are between the ages of 74 and 88 and are a current or former smoker, ask your health care provider if you should have a one-time screening for abdominal aortic aneurysm (AAA). Diabetes Have regular diabetes screenings. This checks your fasting blood sugar level. Have the screening done: Once every three years after age 65 if you are at a normal weight and have a low risk for diabetes. More often and at a younger age if you are overweight or have a high risk for diabetes. What should I know about preventing infection? Hepatitis B If you have a higher risk for hepatitis B, you should be screened for this virus. Talk with your health care provider to find out if you are at risk for hepatitis B infection. Hepatitis C Blood testing is recommended for: Everyone born from [birth year] through 1967. Anyone with known risk factors for hepatitis C. Sexually transmitted infections (STIs) You should be screened each year for STIs, including gonorrhea and chlamydia, if: You are sexually active and are younger than 74 years of age. You are older than 74 years of age and your health care provider tells you that you are at risk for this type of infection. Your sexual activity has changed since you were last screened, and you are at increased risk for chlamydia or gonorrhea. Ask your health care provider if you are at risk. Ask your health care provider about whether you are at high risk for HIV. Your health care provider may recommend a prescription medicine to help prevent HIV infection. If you choose to take medicine to prevent HIV, you should first get tested for HIV. You should then be tested every 3  months for as long as you are taking the medicine. Follow these instructions at home: Alcohol use Do not drink alcohol if your health care provider tells you not to drink. If you drink alcohol: Limit how much you have to 0-2 drinks a day. Know how much alcohol is in your drink. In the [place] one drink equals one 12 oz bottle of beer (355 mL), one 5 oz glass of wine (148 mL), or one 1 oz glass of hard liquor (44 mL). Lifestyle Do not use any products that contain nicotine or tobacco. These products include cigarettes, chewing tobacco, and vaping devices, such as e-cigarettes. If you need help quitting, ask your health care provider. Do not use street drugs. Do not share needles. Ask your health care provider for help if you need support or information about quitting drugs. General instructions Schedule regular health, dental, and eye exams. Stay current with your vaccines. Tell your health care provider if: You often feel depressed. You have ever been abused or do not feel safe at home. Summary Adopting a healthy  lifestyle and getting preventive care are important in promoting health and wellness. Follow your health care provider's instructions about healthy diet, exercising, and getting tested or screened for diseases. Follow your health care provider's instructions on monitoring your cholesterol and blood pressure. This information is not intended to replace advice given to you by your health care provider. Make sure you discuss any questions you have with your health care provider. Document Revised: 10/21/2020 Document Reviewed: 10/21/2020 Elsevier Patient Education  2024 [employer].

## 2023-01-25 ENCOUNTER — Ambulatory Visit
Admission: EM | Admit: 2023-01-25 | Discharge: 2023-01-25 | Disposition: A | Discharge status: Home / Self Care | Payer: Medicare Other | Attending: Family Medicine | Admitting: Family Medicine

## 2023-01-25 ENCOUNTER — Encounter: Payer: Self-pay | Admitting: Emergency Medicine

## 2023-01-25 DIAGNOSIS — T63441A Toxic effect of venom of bees, accidental (unintentional), initial encounter: Secondary | ICD-10-CM

## 2023-01-25 DIAGNOSIS — T7840XA Allergy, unspecified, initial encounter: Secondary | ICD-10-CM | POA: Diagnosis not present

## 2023-01-25 MED ORDER — METHYLPREDNISOLONE ACETATE 80 MG/ML IJ SUSP
80.0000 mg | Freq: Once | INTRAMUSCULAR | Status: AC
  Administered 2023-01-25: 80 mg via INTRAMUSCULAR

## 2023-01-25 NOTE — ED Triage Notes (Signed)
Patient c/o possible insect bite from yellow jackets yesterday while mowing his yard.  Patient is having redness and swelling in his left ear going down his neck.  Patient has taken OTC allergy antihistamine.

## 2023-01-25 NOTE — ED Provider Notes (Signed)
[facility]    CSN: [id number] Arrival date & time: 01/25/23  0943      History   Chief Complaint Chief Complaint  Patient presents with   Insect Bite    HPI Robert Hodges is a 72 y.o. male.   Patient has known skin reaction to yellowjacket.  He was stung yesterday on his left ear and left neck.  He is here today because he has increasing pain swelling and redness in this area.  He has taken a holistic antihistamine, unknown type.  No trouble speaking or swallowing, no shortness of breath, no history of anaphylaxis    Past Medical History:  Diagnosis Date   GERD (gastroesophageal reflux disease)    control with meds   Neuromuscular disorder (HCC) 2010   Pudendal Neuralgia   Seizure disorder [facility])     Patient Active Problem List   Diagnosis Date Noted   Left ankle pain 10/22/2022   Acute left-sided low back pain with left-sided sciatica 06/03/2022   CAP (community acquired pneumonia) 06/03/2022   Lipoma left thigh 03/11/2021   Well adult exam 03/04/2021   Dysphagia 11/02/2016   Colon polyp 11/02/2016   Bradycardia 01/28/2016   GERD (gastroesophageal reflux disease) 10/23/2015   Secondary hyperparathyroidism (HCC) 10/01/2015   External resorption of root of tooth 09/25/2015   Hyperlipidemia 06/20/2008   IMPAIRED FASTING GLUCOSE 03/16/2007   Epilepsy (HCC) 01/18/2007    Past Surgical History:  Procedure Laterality Date   EYE SURGERY     Lasik   pudendal nerve  pudendal nerve    surgery   SPINE SURGERY  07/24/2022   Lumbar Micro-discectomy       Home Medications    Prior to Admission medications   Medication Sig Start Date End Date Taking? Authorizing Provider  carbamazepine (TEGRETOL) 200 MG tablet Take 1 tablet (200 mg total) by mouth daily. 03/05/22  Yes Everrett Coombe, DO  famotidine (PEPCID) 20 MG tablet Take 1 tablet (20 mg total) by mouth 2 (two) times daily. 03/05/22  Yes Everrett Coombe, DO  metroNIDAZOLE (METROGEL) 0.75 % gel Apply  topically 2 (two) times daily. 03/05/22  Yes Everrett Coombe, DO    Family History Family History  Problem Relation Age of Onset   Heart failure Mother     Social History Social History   Tobacco Use   Smoking status: Former   Smokeless tobacco: Never  Vaping Use   Vaping status: Never Used  Substance Use Topics   Alcohol use: Yes    Alcohol/week: 3.0 - 4.0 standard drinks of alcohol    Types: 3 - 4 Standard drinks or equivalent per week    Comment: occassional   Drug use: Not Currently    Types: Other-see comments    Comment: cbd gummies     Allergies   Patient has no known allergies.   Review of Systems Review of Systems See HPI  Physical Exam Triage Vital Signs ED Triage Vitals  Encounter Vitals Group     BP 01/25/23 1000 (!) 147/83     Systolic BP Percentile --      Diastolic BP Percentile --      Pulse Rate 01/25/23 1000 69     Resp 01/25/23 1000 16     Temp 01/25/23 1000 98 F (36.7 C)     Temp Source 01/25/23 1000 Oral     SpO2 01/25/23 1000 98 %     Weight 01/25/23 1002 190 lb (86.2 kg)  Height 01/25/23 1002 5\' 11"  (1.803 m)     Head Circumference --      Peak Flow --      Pain Score 01/25/23 1002 0     Pain Loc --      Pain Education --      Exclude from Growth Chart --    No data found.  Updated Vital Signs BP (!) 147/83 (BP Location: Right Arm)   Pulse 69   Temp 98 F (36.7 C) (Oral)   Resp 16   Ht 5\' 11"  (1.803 m)   Wt 86.2 kg   SpO2 98%   BMI 26.50 kg/m      Physical Exam Constitutional:      General: He is not in acute distress.    Appearance: He is well-developed and normal weight.  HENT:     Head: Normocephalic and atraumatic.   Eyes:     Conjunctiva/sclera: Conjunctivae normal.     Pupils: Pupils are equal, round, and reactive to light.  Cardiovascular:     Rate and Rhythm: Normal rate.  Pulmonary:     Effort: Pulmonary effort is normal. No respiratory distress.  Abdominal:     General: There is no distension.      Palpations: Abdomen is soft.  Musculoskeletal:        General: Normal range of motion.     Cervical back: Normal range of motion.  Skin:    General: Skin is warm and dry.  Neurological:     Mental Status: He is alert.      UC Treatments / Results  Labs (all labs ordered are listed, but only abnormal results are displayed) Labs Reviewed - No data to display  EKG   Radiology No results found.  Procedures Procedures (including critical care time)  Medications Ordered in UC Medications  methylPREDNISolone acetate (DEPO-MEDROL) injection 80 mg (80 mg Intramuscular Given 01/25/23 1013)    Initial Impression / Assessment and Plan / UC Course  I have reviewed the triage vital signs and the nursing notes.  Pertinent labs & imaging results that were available during my care of the patient were reviewed by me and considered in my medical decision making (see chart for details).      Final Clinical Impressions(s) / UC Diagnoses   Final diagnoses:  Allergic reaction, initial encounter  Bee sting reaction, accidental or unintentional, initial encounter     Discharge Instructions      Continue ice and antihistamines   ED Prescriptions   None    PDMP not reviewed this encounter.   Eustace Moore, MD 01/25/23 1023

## 2023-01-25 NOTE — Discharge Instructions (Addendum)
Continue ice and antihistamines

## 2023-02-25 ENCOUNTER — Telehealth: Payer: Self-pay | Admitting: Family Medicine

## 2023-02-25 DIAGNOSIS — Z0000 Encounter for general adult medical examination without abnormal findings: Secondary | ICD-10-CM

## 2023-02-25 DIAGNOSIS — Z125 Encounter for screening for malignant neoplasm of prostate: Secondary | ICD-10-CM

## 2023-02-25 DIAGNOSIS — E7800 Pure hypercholesterolemia, unspecified: Secondary | ICD-10-CM

## 2023-02-25 DIAGNOSIS — G40909 Epilepsy, unspecified, not intractable, without status epilepticus: Secondary | ICD-10-CM

## 2023-02-25 NOTE — Telephone Encounter (Signed)
Patient called in wanting his lab work orders put in before his appointment on 9/24

## 2023-02-26 NOTE — Telephone Encounter (Signed)
Completed.

## 2023-03-09 ENCOUNTER — Encounter: Payer: Self-pay | Admitting: Family Medicine

## 2023-03-09 ENCOUNTER — Ambulatory Visit (INDEPENDENT_AMBULATORY_CARE_PROVIDER_SITE_OTHER): Payer: Medicare Other | Admitting: Family Medicine

## 2023-03-09 VITALS — BP 126/75 | HR 62 | Ht 71.0 in | Wt 192.3 lb

## 2023-03-09 DIAGNOSIS — Z0000 Encounter for general adult medical examination without abnormal findings: Secondary | ICD-10-CM

## 2023-03-09 DIAGNOSIS — Z1212 Encounter for screening for malignant neoplasm of rectum: Secondary | ICD-10-CM

## 2023-03-09 DIAGNOSIS — G40909 Epilepsy, unspecified, not intractable, without status epilepticus: Secondary | ICD-10-CM

## 2023-03-09 DIAGNOSIS — E7800 Pure hypercholesterolemia, unspecified: Secondary | ICD-10-CM | POA: Diagnosis not present

## 2023-03-09 DIAGNOSIS — Z1211 Encounter for screening for malignant neoplasm of colon: Secondary | ICD-10-CM

## 2023-03-09 DIAGNOSIS — Z125 Encounter for screening for malignant neoplasm of prostate: Secondary | ICD-10-CM

## 2023-03-09 MED ORDER — CARBAMAZEPINE 200 MG PO TABS: 200.0000 mg | ORAL_TABLET | Freq: Every day | ORAL | 3 refills | Status: DC

## 2023-03-09 MED ORDER — FAMOTIDINE 20 MG PO TABS: 20.0000 mg | ORAL_TABLET | Freq: Two times a day (BID) | ORAL | 3 refills | Status: DC

## 2023-03-09 NOTE — Assessment & Plan Note (Signed)
Well adult Labs drawn today Screenings: Per lab orders.  Due for updated colon cancer screening, orders entered Immunizations: Declines recommended immunizations Anticipatory guidance/risk factor reduction: Recommendations per AVS.

## 2023-03-09 NOTE — Progress Notes (Signed)
Robert Hodges - 72 y.o. male MRN [id number]  Date of birth: Nov 25, 1950  Subjective Chief Complaint  Patient presents with   Annual Exam    HPI Robert Hodges is a 72 y.o. male here today for annual exam.   Reports that he is doing well  He continues to stay moderately active.  He feels that diet is pretty healthy most of the time.   Non-smoker.  Occasional EtOH use.   Due for colon cancer screening.   Review of Systems  Constitutional:  Negative for chills, fever, malaise/fatigue and weight loss.  HENT:  Negative for congestion, ear pain and sore throat.   Eyes:  Negative for blurred vision, double vision and pain.  Respiratory:  Negative for cough and shortness of breath.   Cardiovascular:  Negative for chest pain and palpitations.  Gastrointestinal:  Negative for abdominal pain, blood in stool, constipation, heartburn and nausea.  Genitourinary:  Negative for dysuria and urgency.  Musculoskeletal:  Negative for joint pain and myalgias.  Neurological:  Negative for dizziness and headaches.  Endo/Heme/Allergies:  Does not bruise/bleed easily.  Psychiatric/Behavioral:  Negative for depression. The patient is not nervous/anxious and does not have insomnia.     Past Medical History:  Diagnosis Date   GERD (gastroesophageal reflux disease)    control with meds   Neuromuscular disorder (HCC) 2010   Pudendal Neuralgia   Seizure disorder [facility])     Past Surgical History:  Procedure Laterality Date   EYE SURGERY     Lasik   pudendal nerve  pudendal nerve    surgery   SPINE SURGERY  07/24/2022   Lumbar Micro-discectomy    Social History   Socioeconomic History   Marital status: Married    Spouse name: Tammy   Number of children: 4   Years of education: 14   Highest education level: Associate degree: occupational, Scientist, product/process development, or vocational program  Occupational History   Occupation: Retired  Tobacco Use   Smoking status: Former   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 He has four children. He enjoys bowling, hiking, walking and going to the gun range.    Social Determinants of Health   Financial Resource Strain: Low Risk  (11/08/2022)   Overall Financial Resource Strain (CARDIA)    Difficulty of Paying Living Expenses: Not hard at all  Food Insecurity: No Food Insecurity (11/08/2022)   Hunger Vital Sign    Worried About Running Out of Food in the Last Year: Never true    Ran Out of Food in the Last Year: Never true  Transportation Needs: No Transportation Needs (11/08/2022)   PRAPARE - Administrator, Civil Service (Medical): No    Lack of Transportation (Non-Medical): No  Physical Activity: Insufficiently Active (11/08/2022)   Exercise Vital Sign    Days of Exercise per Week: 3 days    Minutes of Exercise per Session: 30 min  Stress: No  Stress Concern Present (11/08/2022)   [employer] of Occupational Health - Occupational Stress Questionnaire    Feeling of Stress : Not at all  Social Connections: Moderately Integrated (11/12/2022)   Social Connection and Isolation Panel [NHANES]    Frequency of Communication with Friends and Family: Never    Frequency of Social Gatherings with Friends and Family: Once a week    Attends Religious Services: More than 4 times per year    Active Member of Clubs or Organizations: Yes    Attends Banker Meetings: More than 4 times per year    Marital Status: Married    Family History  Problem Relation Age of Onset   Heart  failure Mother     Health Maintenance  Topic Date Due   Colonoscopy  11/05/2021   Zoster Vaccines- Shingrix (1 of 2) 03/24/2023 (Originally 01/07/1970)   COVID-19 Vaccine (1) 04/09/2023 (Originally 01/06/1956)   INFLUENZA VACCINE  09/13/2023 (Originally 01/14/2023)   Pneumonia Vaccine 41+ Years old (1 of 1 - PCV) 03/23/2024 (Originally 01/06/2014)   Medicare Annual Wellness (AWV)  11/12/2023   DTaP/Tdap/Td (3 - Td or Tdap) 01/13/2024   Hepatitis C Screening  Completed   HPV VACCINES  Aged Out     ----------------------------------------------------------------------------------------------------------------------------------------------------------------------------------------------------------------- Physical Exam BP 126/75 (BP Location: Right Arm, Patient Position: Sitting, Cuff Size: Normal)   Pulse 62   Ht 5\' 11"  (1.803 m)   Wt 192 lb 4.8 oz (87.2 kg)   SpO2 98%   BMI 26.82 kg/m   Physical Exam Constitutional:      General: He is not in acute distress. HENT:     Head: Normocephalic and atraumatic.     Right Ear: Tympanic membrane and external ear normal.     Left Ear: Tympanic membrane and external ear normal.  Eyes:     General: No scleral icterus. Neck:     Thyroid: No thyromegaly.  Cardiovascular:     Rate and Rhythm: Normal rate and regular rhythm.     Heart sounds: Normal heart sounds.  Pulmonary:     Effort: Pulmonary effort is normal.     Breath sounds: Normal breath sounds.  Abdominal:     General: Bowel sounds are normal. There is no distension.     Palpations: Abdomen is soft.     Tenderness: There is no abdominal tenderness. There is no guarding.  Musculoskeletal:     Cervical back: Normal range of motion.  Lymphadenopathy:     Cervical: No cervical adenopathy.  Skin:    General: Skin is warm and dry.     Findings: No rash.  Neurological:     Mental Status: He is alert and oriented to person, place, and time.     Cranial Nerves: No cranial nerve  deficit.     Motor: No abnormal muscle tone.  Psychiatric:        Mood and Affect: Mood normal.        Behavior: Behavior normal.     ------------------------------------------------------------------------------------------------------------------------------------------------------------------------------------------------------------------- Assessment and Plan  Well adult exam Well adult Labs drawn today Screenings: Per lab orders.  Due for updated colon cancer screening, orders entered Immunizations: Declines recommended immunizations Anticipatory guidance/risk factor reduction: Recommendations per AVS.  Epilepsy (HCC) Tegretol renewed. Checking levels today.    Meds ordered this encounter  Medications   carbamazepine (TEGRETOL) 200 MG tablet    Sig: Take 1 tablet (200 mg total) by mouth daily.    Dispense:  90 tablet    Refill:  3  famotidine (PEPCID) 20 MG tablet    Sig: Take 1 tablet (20 mg total) by mouth 2 (two) times daily.    Dispense:  180 tablet    Refill:  3    No follow-ups on file.    This visit occurred during the SARS-CoV-2 public health emergency.  Safety protocols were in place, including screening questions prior to the visit, additional usage of staff PPE, and extensive cleaning of exam room while observing appropriate contact time as indicated for disinfecting solutions.

## 2023-03-09 NOTE — Patient Instructions (Signed)
Preventive Care 65 Years and Older, Male Preventive care refers to lifestyle choices and visits with your health care provider that can promote health and wellness. Preventive care visits are also called wellness exams. What can I expect for my preventive care visit? Counseling During your preventive care visit, your health care provider may ask about your: Medical history, including: Past medical problems. Family medical history. History of falls. Current health, including: Emotional well-being. Home life and relationship well-being. Sexual activity. Memory and ability to understand (cognition). Lifestyle, including: Alcohol, nicotine or tobacco, and drug use. Access to firearms. Diet, exercise, and sleep habits. Work and work environment. Sunscreen use. Safety issues such as seatbelt and bike helmet use. Physical exam Your health care provider will check your: Height and weight. These may be used to calculate your BMI (body mass index). BMI is a measurement that tells if you are at a healthy weight. Waist circumference. This measures the distance around your waistline. This measurement also tells if you are at a healthy weight and may help predict your risk of certain diseases, such as type 2 diabetes and high blood pressure. Heart rate and blood pressure. Body temperature. Skin for abnormal spots. What immunizations do I need?  Vaccines are usually given at various ages, according to a schedule. Your health care provider will recommend vaccines for you based on your age, medical history, and lifestyle or other factors, such as travel or where you work. What tests do I need? Screening Your health care provider may recommend screening tests for certain conditions. This may include: Lipid and cholesterol levels. Diabetes screening. This is done by checking your blood sugar (glucose) after you have not eaten for a while (fasting). Hepatitis C test. Hepatitis B test. HIV (human  immunodeficiency virus) test. STI (sexually transmitted infection) testing, if you are at risk. Lung cancer screening. Colorectal cancer screening. Prostate cancer screening. Abdominal aortic aneurysm (AAA) screening. You may need this if you are a current or former smoker. Talk with your health care provider about your test results, treatment options, and if necessary, the need for more tests. Follow these instructions at home: Eating and drinking  Eat a diet that includes fresh fruits and vegetables, whole grains, lean protein, and low-fat dairy products. Limit your intake of foods with high amounts of sugar, saturated fats, and salt. Take vitamin and mineral supplements as recommended by your health care provider. Do not drink alcohol if your health care provider tells you not to drink. If you drink alcohol: Limit how much you have to 0-2 drinks a day. Know how much alcohol is in your drink. In the [place] one drink equals one 12 oz bottle of beer (355 mL), one 5 oz glass of wine (148 mL), or one 1 oz glass of hard liquor (44 mL). Lifestyle Brush your teeth every morning and night with fluoride toothpaste. Floss one time each day. Exercise for at least 30 minutes 5 or more days each week. Do not use any products that contain nicotine or tobacco. These products include cigarettes, chewing tobacco, and vaping devices, such as e-cigarettes. If you need help quitting, ask your health care provider. Do not use drugs. If you are sexually active, practice safe sex. Use a condom or other form of protection to prevent STIs. Take aspirin only as told by your health care provider. Make sure that you understand how much to take and what form to take. Work with your health care provider to find out whether it is safe   and beneficial for you to take aspirin daily. Ask your health care provider if you need to take a cholesterol-lowering medicine (statin). Find healthy ways to manage stress, such  as: Meditation, yoga, or listening to music. Journaling. Talking to a trusted person. Spending time with friends and family. Safety Always wear your seat belt while driving or riding in a vehicle. Do not drive: If you have been drinking alcohol. Do not ride with someone who has been drinking. When you are tired or distracted. While texting. If you have been using any mind-altering substances or drugs. Wear a helmet and other protective equipment during sports activities. If you have firearms in your house, make sure you follow all gun safety procedures. Minimize exposure to UV radiation to reduce your risk of skin cancer. What's next? Visit your health care provider once a year for an annual wellness visit. Ask your health care provider how often you should have your eyes and teeth checked. Stay up to date on all vaccines. This information is not intended to replace advice given to you by your health care provider. Make sure you discuss any questions you have with your health care provider. Document Revised: 11/27/2020 Document Reviewed: 11/27/2020 Elsevier Patient Education  2024 [employer]vier Inc.  

## 2023-03-09 NOTE — Assessment & Plan Note (Signed)
Tegretol renewed. Checking levels today.

## 2023-03-10 LAB — CMP14+EGFR
ALT: 20 IU/L (ref 0–44)
AST: 16 IU/L (ref 0–40)
Albumin: 4.5 g/dL (ref 3.8–4.8)
Alkaline Phosphatase: 90 IU/L (ref 44–121)
BUN/Creatinine Ratio: 18 (ref 10–24)
BUN: 17 mg/dL (ref 8–27)
Bilirubin Total: 0.2 mg/dL (ref 0.0–1.2)
CO2: 23 mmol/L (ref 20–29)
Calcium: 9.7 mg/dL (ref 8.6–10.2)
Chloride: 104 mmol/L (ref 96–106)
Creatinine, Ser: 0.97 mg/dL (ref 0.76–1.27)
Globulin, Total: 2.5 g/dL (ref 1.5–4.5)
Glucose: 128 mg/dL — ABNORMAL HIGH (ref 70–99)
Potassium: 4.5 mmol/L (ref 3.5–5.2)
Sodium: 142 mmol/L (ref 134–144)
Total Protein: 7 g/dL (ref 6.0–8.5)
eGFR: 82 mL/min/{1.73_m2} (ref >59)

## 2023-03-10 LAB — PSA: Prostate Specific Ag, Serum: 1.8 ng/mL (ref 0.0–4.0)

## 2023-03-10 LAB — CARBAMAZEPINE LEVEL, TOTAL: Carbamazepine (Tegretol), S: 4.7 ug/mL (ref 4.0–12.0)

## 2023-03-10 LAB — CBC
Hematocrit: 43 % (ref 37.5–51.0)
Hemoglobin: 14 g/dL (ref 13.0–17.7)
MCH: 31.1 pg (ref 26.6–33.0)
MCHC: 32.6 g/dL (ref 31.5–35.7)
MCV: 96 fL (ref 79–97)
Platelets: 209 10*3/uL (ref 150–450)
RBC: 4.5 x10E6/uL (ref 4.14–5.80)
RDW: 12 % (ref 11.6–15.4)
WBC: 5.3 10*3/uL (ref 3.4–10.8)

## 2023-03-10 LAB — LIPID PANEL
Chol/HDL Ratio: 6.1 ratio — ABNORMAL HIGH (ref 0.0–5.0)
Cholesterol, Total: 250 mg/dL — ABNORMAL HIGH (ref 100–199)
HDL: 41 mg/dL (ref >39)
LDL Chol Calc (NIH): 162 mg/dL — ABNORMAL HIGH (ref 0–99)
Triglycerides: 254 mg/dL — ABNORMAL HIGH (ref 0–149)
VLDL Cholesterol Cal: 47 mg/dL — ABNORMAL HIGH (ref 5–40)

## 2023-03-10 LAB — HEMOGLOBIN A1C
Est. average glucose Bld gHb Est-mCnc: 146 mg/dL
Hgb A1c MFr Bld: 6.7 % — ABNORMAL HIGH (ref 4.8–5.6)

## 2023-06-29 DIAGNOSIS — K080 Exfoliation of teeth due to systemic causes: Secondary | ICD-10-CM | POA: Diagnosis not present

## 2023-07-06 DIAGNOSIS — K5730 Diverticulosis of large intestine without perforation or abscess without bleeding: Secondary | ICD-10-CM | POA: Diagnosis not present

## 2023-07-06 DIAGNOSIS — Z1211 Encounter for screening for malignant neoplasm of colon: Secondary | ICD-10-CM | POA: Diagnosis not present

## 2023-07-06 DIAGNOSIS — Z860100 Personal history of colon polyps, unspecified: Secondary | ICD-10-CM | POA: Diagnosis not present

## 2023-07-06 DIAGNOSIS — Z860101 Personal history of adenomatous and serrated colon polyps: Secondary | ICD-10-CM | POA: Diagnosis not present

## 2023-07-06 DIAGNOSIS — K648 Other hemorrhoids: Secondary | ICD-10-CM | POA: Diagnosis not present

## 2023-07-06 DIAGNOSIS — Z09 Encounter for follow-up examination after completed treatment for conditions other than malignant neoplasm: Secondary | ICD-10-CM | POA: Diagnosis not present

## 2023-07-06 LAB — HM COLONOSCOPY

## 2023-07-12 ENCOUNTER — Ambulatory Visit
Admission: EM | Admit: 2023-07-12 | Discharge: 2023-07-12 | Disposition: A | Discharge status: Home / Self Care | Payer: Medicare Other

## 2023-07-12 ENCOUNTER — Other Ambulatory Visit: Payer: Self-pay

## 2023-07-12 DIAGNOSIS — R448 Other symptoms and signs involving general sensations and perceptions: Secondary | ICD-10-CM

## 2023-07-12 DIAGNOSIS — R2689 Other abnormalities of gait and mobility: Secondary | ICD-10-CM

## 2023-07-12 NOTE — ED Triage Notes (Addendum)
Saturday morning went to bathroom and noticed he had a sinus headache and was leaning right and had to help balance himself on the wall. Sunday did not notice any weakness. Today he is still leaning to right and "wobbly". No dizziness but feels "wobbly". Still has headache some but is better. No facial droop, hand grips equal bilaterally, leg strength equal bilaterally. Had colonoscopy last Tuesday, Friday night had 2 drinks per patient as well.

## 2023-07-12 NOTE — ED Provider Notes (Signed)
[facility]    CSN: [id number] Arrival date & time: 07/12/23  2536      History   Chief Complaint Chief Complaint  Patient presents with   Facial Pain    HPI Robert Hodges is a 73 y.o. male.   73 year old male who presents to urgent care with complaints of right sided sinus pressure, headache and balance issues.  This started on Saturday after he got up.  He briefly had balance issues walking down the hall with sinus pressure behind the eye on the right.  He took ibuprofen and the symptoms improved.  He still had some residual symptoms on Sunday and took ibuprofen then as well with improvement of symptoms.  He presents today with only mild residual symptoms.  He denies any unilateral symptoms, facial paralysis, slurred speech, lower extremity weakness.  He did note briefly on Saturday a slight weakness in the right arm.  He denies any history of the symptoms.  He does have a primary care physician that he sees regularly.  He did have a colonoscopy on Tuesday and has been trying to stay hydrated since then.  He has seizure disorder and takes carbamazepine for this.  He missed 1 dose due to his colonoscopy.  He has not had any seizure-like symptoms.     Past Medical History:  Diagnosis Date   GERD (gastroesophageal reflux disease)    control with meds   Neuromuscular disorder (HCC) 2010   Pudendal Neuralgia   Seizure disorder [facility])     Patient Active Problem List   Diagnosis Date Noted   Left ankle pain 10/22/2022   Well adult exam 03/04/2021   Colon polyp 11/02/2016   GERD (gastroesophageal reflux disease) 10/23/2015   Secondary hyperparathyroidism (HCC) 10/01/2015   External resorption of root of tooth 09/25/2015   Hyperlipidemia 06/20/2008   IMPAIRED FASTING GLUCOSE 03/16/2007   Epilepsy (HCC) 01/18/2007    Past Surgical History:  Procedure Laterality Date   EYE SURGERY     Lasik   pudendal nerve  pudendal nerve    surgery   SPINE SURGERY  07/24/2022    Lumbar Micro-discectomy       Home Medications    Prior to Admission medications   Medication Sig Start Date End Date Taking? Authorizing Provider  carbamazepine (TEGRETOL) 200 MG tablet Take 1 tablet (200 mg total) by mouth daily. 03/09/23   Everrett Coombe, DO  famotidine (PEPCID) 20 MG tablet Take 1 tablet (20 mg total) by mouth 2 (two) times daily. 03/09/23   Everrett Coombe, DO  metroNIDAZOLE (METROGEL) 0.75 % gel Apply topically 2 (two) times daily. 03/05/22   Everrett Coombe, DO    Family History Family History  Problem Relation Age of Onset   Heart failure Mother     Social History Social History   Tobacco Use   Smoking status: Former   Smokeless tobacco: Never  Vaping Use   Vaping status: Never Used  Substance Use Topics   Alcohol use: Yes    Alcohol/week: 3.0 - 4.0 standard drinks of alcohol    Types: 3 - 4 Standard drinks or equivalent per week    Comment: occassional   Drug use: Not Currently    Types: Other-see comments    Comment: cbd gummies     Allergies   Patient has no known allergies.   Review of Systems Review of Systems  Constitutional:  Negative for chills and fever.  HENT:  Positive for sinus pressure. Negative for ear pain  and sore throat.   Eyes:  Negative for pain and visual disturbance.  Respiratory:  Negative for cough and shortness of breath.   Cardiovascular:  Negative for chest pain and palpitations.  Gastrointestinal:  Negative for abdominal pain and vomiting.  Genitourinary:  Negative for dysuria and hematuria.  Musculoskeletal:  Negative for arthralgias and back pain.  Skin:  Negative for color change and rash.  Neurological:  Positive for headaches. Negative for seizures and syncope.       Balance issue  All other systems reviewed and are negative.    Physical Exam Triage Vital Signs ED Triage Vitals  Encounter Vitals Group     BP 07/12/23 0839 135/74     Systolic BP Percentile --      Diastolic BP Percentile --       Pulse Rate 07/12/23 0839 88     Resp 07/12/23 0839 16     Temp 07/12/23 0839 98.3 F (36.8 C)     Temp src --      SpO2 07/12/23 0839 98 %     Weight --      Height --      Head Circumference --      Peak Flow --      Pain Score 07/12/23 0844 1     Pain Loc --      Pain Education --      Exclude from Growth Chart --    No data found.  Updated Vital Signs BP 135/74   Pulse 88   Temp 98.3 F (36.8 C)   Resp 16   SpO2 98%   Visual Acuity Right Eye Distance:   Left Eye Distance:   Bilateral Distance:    Right Eye Near:   Left Eye Near:    Bilateral Near:     Physical Exam Vitals and nursing note reviewed.  Constitutional:      General: He is not in acute distress.    Appearance: He is well-developed.  HENT:     Head: Normocephalic and atraumatic.     Right Ear: Tympanic membrane normal.     Left Ear: Tympanic membrane normal.     Nose: Nose normal.     Mouth/Throat:     Mouth: Mucous membranes are moist.  Eyes:     General: No visual field deficit.    Conjunctiva/sclera: Conjunctivae normal.  Cardiovascular:     Rate and Rhythm: Normal rate and regular rhythm.     Heart sounds: No murmur heard. Pulmonary:     Effort: Pulmonary effort is normal. No respiratory distress.     Breath sounds: Normal breath sounds.  Abdominal:     Palpations: Abdomen is soft.     Tenderness: There is no abdominal tenderness.  Musculoskeletal:        General: No swelling.     Cervical back: Neck supple.  Skin:    General: Skin is warm and dry.     Capillary Refill: Capillary refill takes less than 2 seconds.  Neurological:     General: No focal deficit present.     Mental Status: He is alert.     Cranial Nerves: Cranial nerves 2-12 are intact. No cranial nerve deficit or facial asymmetry.     Motor: No weakness, tremor or abnormal muscle tone.     Coordination: Finger-Nose-Finger Test normal.     Gait: Gait is intact.  Psychiatric:        Mood and Affect: Mood normal.  UC Treatments / Results  Labs (all labs ordered are listed, but only abnormal results are displayed) Labs Reviewed - No data to display  EKG   Radiology No results found.  Procedures Procedures (including critical care time)  Medications Ordered in UC Medications - No data to display  Initial Impression / Assessment and Plan / UC Course  I have reviewed the triage vital signs and the nursing notes.  Pertinent labs & imaging results that were available during my care of the patient were reviewed by me and considered in my medical decision making (see chart for details).     Facial pressure  Balance problem   Symptoms likely not due to an infectious source.  Symptoms today are improving and have improved rapidly over 2 days.  Although a cerebral event such as a TIA cannot be completely ruled out, given that the neurological exam is normal and the symptoms are almost resolved now, it is reasonable to monitor the symptoms as an outpatient.  Recommend making a follow-up as soon as possible with your primary care physician.  Should your symptoms recur or worsen at any time, immediately go to the nearest emergency department where advanced imaging can be performed.  Final Clinical Impressions(s) / UC Diagnoses   Final diagnoses:  Facial pressure  Balance problem     Discharge Instructions      Symptoms likely not due to an infectious source.  Symptoms today are improving and have improved rapidly over 2 days.  Although a cerebral event such as a TIA cannot be completely ruled out, given that the neurological exam is normal and the symptoms are almost resolved now, it is reasonable to monitor the symptoms as an outpatient.  Recommend making a follow-up as soon as possible with your primary care physician.  Should your symptoms recur or worsen at any time, immediately go to the nearest emergency department where advanced imaging can be performed.   ED Prescriptions    None    PDMP not reviewed this encounter.   Landis Martins, [place] 07/12/23 [phone]

## 2023-07-12 NOTE — Discharge Instructions (Addendum)
Symptoms likely not due to an infectious source.  Symptoms today are improving and have improved rapidly over 2 days.  Although a cerebral event such as a TIA cannot be completely ruled out, given that the neurological exam is normal and the symptoms are almost resolved now, it is reasonable to monitor the symptoms as an outpatient.  Recommend making a follow-up as soon as possible with your primary care physician.  Should your symptoms recur or worsen at any time, immediately go to the nearest emergency department where advanced imaging can be performed.

## 2023-07-15 ENCOUNTER — Encounter: Payer: Self-pay | Admitting: Family Medicine

## 2023-07-15 ENCOUNTER — Ambulatory Visit (INDEPENDENT_AMBULATORY_CARE_PROVIDER_SITE_OTHER): Payer: Medicare Other | Admitting: Family Medicine

## 2023-07-15 VITALS — BP 130/75 | HR 62 | Ht 71.0 in | Wt 192.0 lb

## 2023-07-15 DIAGNOSIS — G459 Transient cerebral ischemic attack, unspecified: Secondary | ICD-10-CM | POA: Diagnosis not present

## 2023-07-15 DIAGNOSIS — E7800 Pure hypercholesterolemia, unspecified: Secondary | ICD-10-CM | POA: Diagnosis not present

## 2023-07-15 NOTE — Progress Notes (Signed)
Robert Hodges - 73 y.o. male MRN [id number]  Date of birth: 02/23/1951  Subjective Chief Complaint  Patient presents with   Follow-up    HPI Robert Hodges is a 73 y.o. male here today for follow up of recent urgent care visit.   He presented with complaint of facial pressure and imbalance. Complained of R sided sinus pressure, headache and balance issues that started 5 days ago.  He had a colonoscopy the week prior that did not seem to have any immediate complications.  He did miss one dose of his tegretol for the colonoscopy but did not note any seizure like activity.  He presented to the Urgent care on Monday and symptoms had improved from the initial onset.  There did not appear to be any infectious cause.  Consideration of TIA was given as well but instructed to follow up with PCP.   Today he reports that R arm weakness has resolved but he has residual R sided lower extremity weakness.  He is still leaning towards that right when walking.  Doesn't feel particularly dizzy.  He does have some foggy thinking.    ROS:  A comprehensive ROS was completed and negative except as noted per HPI  No Known Allergies  Past Medical History:  Diagnosis Date   GERD (gastroesophageal reflux disease)    control with meds   Neuromuscular disorder (HCC) 2010   Pudendal Neuralgia   Seizure disorder [facility])     Past Surgical History:  Procedure Laterality Date   EYE SURGERY     Lasik   pudendal nerve  pudendal nerve    surgery   SPINE SURGERY  07/24/2022   Lumbar Micro-discectomy    Social History   Socioeconomic History   Marital status: Married    Spouse name: Tammy   Number of children: 4   Years of education: 14   Highest education level: Associate degree: occupational, Scientist, product/process development, or vocational program  Occupational History   Occupation: Retired  Tobacco Use   Smoking status: Former   Smokeless tobacco: Never  Advertising account planner   Vaping status: Never Used  Substance and Sexual Activity    Alcohol use: Yes    Alcohol/week: 3.0 - 4.0 standard drinks of alcohol    Types: 3 - 4 Standard drinks or equivalent per week    Comment: occassional   Drug use: Not Currently    Types: Other-see comments    Comment: cbd gummies   Sexual activity: Not Currently    Partners: Female  Other Topics Concern   Not on file  Social History Narrative   Lives with his wife, son, daughter and grand-daughter. He has four children. He enjoys bowling, hiking, walking and going to the gun range.    Social Drivers of Corporate investment banker Strain: Low Risk  (11/08/2022)   Overall Financial Resource Strain (CARDIA)    Difficulty of Paying Living Expenses: Not hard at all  Food Insecurity: No Food Insecurity (11/08/2022)   Hunger Vital Sign    Worried About Running Out of Food in the Last Year: Never true    Ran Out of Food in the Last Year: Never true  Transportation Needs: No Transportation Needs (11/08/2022)   PRAPARE - Administrator, Civil Service (Medical): No    Lack of Transportation (Non-Medical): No  Physical Activity: Insufficiently Active (11/08/2022)   Exercise Vital Sign    Days of Exercise per Week: 3 days    Minutes of Exercise  per Session: 30 min  Stress: No Stress Concern Present (11/08/2022)   [employer] of Occupational Health - Occupational Stress Questionnaire    Feeling of Stress : Not at all  Social Connections: Moderately Integrated (11/12/2022)   Social Connection and Isolation Panel [NHANES]    Frequency of Communication with Friends and Family: Never    Frequency of Social Gatherings with Friends and Family: Once a week    Attends Religious Services: More than 4 times per year    Active Member of Clubs or Organizations: Yes    Attends Banker Meetings: More than 4 times per year    Marital Status: Married    Family History  Problem Relation Age of Onset   Heart failure Mother     Health Maintenance  Topic Date Due    Colonoscopy  11/05/2021   COVID-19 Vaccine (1) 07/31/2023 (Originally 01/06/1956)   INFLUENZA VACCINE  09/13/2023 (Originally 01/14/2023)   Zoster Vaccines- Shingrix (1 of 2) 10/13/2023 (Originally [birth year]/07/1968)   Pneumonia Vaccine 35+ Years old (1 of 1 - PCV) 03/23/2024 (Originally 01/06/2014)   Medicare Annual Wellness (AWV)  11/12/2023   DTaP/Tdap/Td (3 - Td or Tdap) 01/13/2024   Hepatitis C Screening  Completed   HPV VACCINES  Aged Out     ----------------------------------------------------------------------------------------------------------------------------------------------------------------------------------------------------------------- Physical Exam BP 130/75 (BP Location: Left Arm, Patient Position: Sitting, Cuff Size: Large)   Pulse 62   Ht 5\' 11"  (1.803 m)   Wt 192 lb (87.1 kg)   SpO2 99%   BMI 26.78 kg/m   Physical Exam Constitutional:      Appearance: Normal appearance.  HENT:     Head: Normocephalic and atraumatic.  Cardiovascular:     Rate and Rhythm: Normal rate and regular rhythm.  Neurological:     General: No focal deficit present.     Mental Status: He is alert and oriented to person, place, and time.     Cranial Nerves: No cranial nerve deficit.     Gait: Gait abnormal (Leaning to the right slightly when walking.  Unable to complete tandem gait .).     Comments: Normal finger to nose.   Grip strength slightly diminished on R compared to L.   Psychiatric:        Mood and Affect: Mood normal.        Behavior: Behavior normal.     ------------------------------------------------------------------------------------------------------------------------------------------------------------------------------------------------------------------- Assessment and Plan  TIA (transient ischemic attack) Concern for TIA given his current symptoms.  It has been 5 days since onset without significant progression and symptoms are mostly resolved.  I don't think emergent  evaluation is needed but I have ordered STAT MRI brain, CT angio of the head and neck and Echo.  I will have him start 81mg  ASA.  He does have HLD and refused statin in the past.  Discussed recommendations, he will consider this but would like to see what imaging shows first.    No orders of the defined types were placed in this encounter.   No follow-ups on file.    This visit occurred during the SARS-CoV-2 public health emergency.  Safety protocols were in place, including screening questions prior to the visit, additional usage of staff PPE, and extensive cleaning of exam room while observing appropriate contact time as indicated for disinfecting solutions.

## 2023-07-15 NOTE — Assessment & Plan Note (Signed)
Concern for TIA given his current symptoms.  It has been 5 days since onset without significant progression and symptoms are mostly resolved.  I don't think emergent evaluation is needed but I have ordered STAT MRI brain, CT angio of the head and neck and Echo.  I will have him start 81mg  ASA.  He does have HLD and refused statin in the past.  Discussed recommendations, he will consider this but would like to see what imaging shows first.

## 2023-07-15 NOTE — Patient Instructions (Addendum)
Start 81mg  aspirin.  If symptoms return or worsen go to the ER immediately   Transient Ischemic Attack A transient ischemic attack (TIA) happens when blood supply to the brain is blocked temporarily. A TIA causes stroke-like symptoms that go away quickly without causing any permanent damage. Having a TIA can be considered a warning sign for a stroke and should not be ignored. A person who has a TIA is at higher risk for a stroke. What are the causes? This condition is caused by a temporary blockage in an artery in the head or neck. This means the brain does not get the blood supply it needs. A blockage can be caused by: Fatty buildup in an artery in the head or neck (atherosclerosis). A blood clot traveling from the heart. An artery tear (dissection). Inflammation of an artery (vasculitis). Sometimes the cause is not known. What increases the risk? Certain factors make you more likely to develop this condition. Some of these are things you can change, including: Using products that contain nicotine or tobacco. Being inactive. Heavy alcohol use. Drug use, especially cocaine and methamphetamine. Medical conditions that may increase your risk include: High blood pressure (hypertension). High cholesterol. Diabetes. Heart disease (coronary artery disease). An irregular heartbeat, also called atrial fibrillation (AFib). Sickle cell disease. Blood clotting disorders (hypercoagulable state). Other risk factors include: Being over the age of 2. Being male. Obesity. Sleep problems such as sleep apnea. Family history of stroke. Previous history of blood clots, stroke, TIA, or heart attack. What are the signs or symptoms? Symptoms of a TIA are the same as those of a stroke. The symptoms develop suddenly, and then go away quickly. They may include: Dizziness, loss of balance and coordination, or trouble walking. Vision changes, such as double vision, blurred vision, or loss of  vision. Weakness or numbness in your face, arm, or leg, especially on one side of your body. Trouble speaking, understanding speech, or both (aphasia). Nausea and vomiting. Severe headache. Confusion. If possible, note what time your symptoms started. Tell your health care provider. How is this diagnosed? This condition may be diagnosed based on: Your symptoms and medical history. A physical exam. Imaging tests, usually a CT scan or MRI of the brain. Blood tests. You may also have other tests, including: Electrocardiogram (ECG). Echocardiogram. Continuous heart monitoring. Carotid ultrasound. A scan of blood circulation in the brain (CT angiogram or MR angiogram). How is this treated? The goal of treatment is to reduce the risk for a stroke. Stroke prevention therapies may include: Changes to diet and lifestyle, such as being physically active and stopping smoking. Treating other health conditions, such as diabetes or AFib. Medicines to thin the blood (antiplatelets or anticoagulants). Blood pressure medicines. Medicines to reduce cholesterol. If testing shows a narrowing in the arteries to your brain, your health care provider may recommend a procedure, such as: Carotid endarterectomy. This is done to remove the blockage from your artery. Carotid angioplasty and stenting. This uses a small mesh tube (stent) to open or widen an artery in the neck. The stent helps keep the artery open by supporting the artery walls. Follow these instructions at home: Medicines Take over-the-counter and prescription medicines only as told by your health care provider. If you were told to take a medicine to thin your blood, such as aspirin or an anticoagulant, use it exactly as told by your health care provider. Taking too much blood-thinning medicine can cause bleeding. Taking too little will not protect you against a  stroke and other problems. Eating and drinking  Eat 5 or more servings of fruits  and vegetables each day. Follow guidelines from your health care provider about your diet. You may need to follow a certain diet to help manage risk factors for stroke. This may include: Eating a low-fat, low-salt diet. Choosing high-fiber foods. Limiting carbohydrates and sugar. If you drink alcohol: Limit how much you have to: 0-1 drink a day for women who are not pregnant. 0-2 drinks a day for men. Know how much alcohol is in your drink. In the [place] one drink equals one 12 oz bottle of beer (355 mL), one 5 oz glass of wine (148 mL), or one 1 oz glass of hard liquor (44 mL). General instructions Maintain a healthy weight. Try to get at least 30 minutes of exercise on most days. Get treatment if you have sleep apnea. Do not use any products that contain nicotine or tobacco. These products include cigarettes, chewing tobacco, and vaping devices, such as e-cigarettes. If you need help quitting, ask your health care provider. Do not use illegal drugs. Keep all follow-up visits. Your health care provider will want to know if you have any more symptoms and to check blood labs if any medicines were prescribed. Where to find more information American Stroke Association: stroke.org Get help right away if: You have chest pain. You have fast or irregular heartbeats (palpitations). You have any symptoms of a stroke. "BE FAST" is an easy way to remember the main warning signs of a stroke. B - Balance. Signs are dizziness, sudden trouble walking, or loss of balance. E - Eyes. Signs are trouble seeing or a sudden change in vision. F - Face. Signs are sudden weakness or numbness of the face, or the face or eyelid drooping on one side. A - Arms. Signs are weakness or numbness in an arm. This happens suddenly and usually on one side of the body. S - Speech.Signs are sudden trouble speaking, slurred speech, or trouble understanding what people say. T - Time. Time to call emergency services. Write down  what time symptoms started. You have other signs of a stroke, such as: A sudden, severe headache with no known cause. Nausea or vomiting. Seizure. These symptoms may be an emergency. Get help right away. Call 911. Do not wait to see if the symptoms will go away. Do not drive yourself to the hospital. This information is not intended to replace advice given to you by your health care provider. Make sure you discuss any questions you have with your health care provider. Document Revised: 11/14/2021 Document Reviewed: 11/14/2021 Elsevier Patient Education  2024 [employer].

## 2023-07-16 ENCOUNTER — Telehealth: Payer: Self-pay | Admitting: Family Medicine

## 2023-07-16 ENCOUNTER — Telehealth (HOSPITAL_BASED_OUTPATIENT_CLINIC_OR_DEPARTMENT_OTHER): Payer: Self-pay

## 2023-07-16 NOTE — Telephone Encounter (Unsigned)
Copied from CRM [id number]. Topic: General - Other >> Jul 16, 2023 12:31 PM Eunice Blase wrote: Reason for CRM: Received call from [facility] per Gearldine Bienenstock [phone] needs a prior authorization for CT and MRI. Pt is scheduled for Sunday, Feb. 2, 2025. Please call.

## 2023-07-18 ENCOUNTER — Ambulatory Visit (HOSPITAL_BASED_OUTPATIENT_CLINIC_OR_DEPARTMENT_OTHER): Payer: Medicare Other

## 2023-07-18 ENCOUNTER — Ambulatory Visit (HOSPITAL_BASED_OUTPATIENT_CLINIC_OR_DEPARTMENT_OTHER)
Admission: RE | Admit: 2023-07-18 | Discharge: 2023-07-18 | Disposition: A | Discharge status: Home / Self Care | Payer: Medicare Other | Source: Ambulatory Visit | Attending: Family Medicine | Admitting: Family Medicine

## 2023-07-18 DIAGNOSIS — I6782 Cerebral ischemia: Secondary | ICD-10-CM | POA: Diagnosis not present

## 2023-07-18 DIAGNOSIS — R29898 Other symptoms and signs involving the musculoskeletal system: Secondary | ICD-10-CM | POA: Diagnosis not present

## 2023-07-18 DIAGNOSIS — G459 Transient cerebral ischemic attack, unspecified: Secondary | ICD-10-CM | POA: Diagnosis not present

## 2023-07-18 DIAGNOSIS — R519 Headache, unspecified: Secondary | ICD-10-CM | POA: Diagnosis not present

## 2023-07-21 ENCOUNTER — Telehealth: Payer: Self-pay

## 2023-07-21 ENCOUNTER — Encounter: Payer: Self-pay | Admitting: Family Medicine

## 2023-07-21 DIAGNOSIS — K080 Exfoliation of teeth due to systemic causes: Secondary | ICD-10-CM | POA: Diagnosis not present

## 2023-07-21 NOTE — Telephone Encounter (Signed)
Copied from CRM [id number]. Topic: Clinical - Lab/Test Results >> Jul 20, 2023  1:23 PM Nila Nephew wrote: Reason for CRM: Patient calling to request provider review MRI results at earliest convenience.

## 2023-07-22 ENCOUNTER — Telehealth: Payer: Self-pay

## 2023-07-22 NOTE — Telephone Encounter (Signed)
 Copied from CRM [id number]. Topic: Clinical - Lab/Test Results >> Jul 21, 2023  5:26 PM Deleta HERO wrote: Reason for CRM: The patient states his insurance previously denied the CT Angiogram, and he reviewed Dr. Alvia reply in MyChart concerning his recent MRI. He is wanting to know if Dr. Alvia can speak with his insurance company about this further due to the concerns mentioned, so he may have a CT angiogram done.

## 2023-07-22 NOTE — Telephone Encounter (Signed)
 Forwarding the message to provider and  anairis lopez -for radiology scheduling issues . Aaron Aas

## 2023-07-22 NOTE — Telephone Encounter (Signed)
 Forwarding message to Anairis lopez as well  as to Dr. Augustus Ledger for review.

## 2023-07-22 NOTE — Telephone Encounter (Signed)
 Copied from CRM [id number]. Topic: Clinical - Medical Advice >> Jul 22, 2023 12:17 PM Robert Hodges wrote: Reason for CRM: Patient is calling in to state that, in response to the previous mychart message, he is slightly confused. Patient states the CT ordered was denied by insurance and patient is inquiring if an appeal can be placed.   Patient states he is having lingering symptoms of weakness in right shoulder. Patient is concerned and seeking advice.   Patient also states his echo was scheduled for the Feb 19th instead of stat.

## 2023-07-23 ENCOUNTER — Telehealth: Payer: Self-pay

## 2023-07-23 NOTE — Telephone Encounter (Signed)
 Copied from CRM [id number]. Topic: Clinical - Request for Lab/Test Order >> Jul 23, 2023  1:03 PM Elle L wrote: Reason for CRM: A CRM was sent 2/6 regarding appealing the patient's denial for his CT scan. However, the patient called to up date this as he states that he received a letter from [employer] Authorization #[id number] showing that the CT scan was approved. The patient's call back number is [phone].

## 2023-07-24 ENCOUNTER — Ambulatory Visit (HOSPITAL_BASED_OUTPATIENT_CLINIC_OR_DEPARTMENT_OTHER)
Admission: RE | Admit: 2023-07-24 | Discharge: 2023-07-24 | Disposition: A | Discharge status: Home / Self Care | Payer: Medicare Other | Source: Ambulatory Visit | Attending: Family Medicine | Admitting: Family Medicine

## 2023-07-24 DIAGNOSIS — G459 Transient cerebral ischemic attack, unspecified: Secondary | ICD-10-CM | POA: Diagnosis not present

## 2023-07-24 DIAGNOSIS — I6503 Occlusion and stenosis of bilateral vertebral arteries: Secondary | ICD-10-CM | POA: Diagnosis not present

## 2023-07-24 DIAGNOSIS — I6523 Occlusion and stenosis of bilateral carotid arteries: Secondary | ICD-10-CM | POA: Diagnosis not present

## 2023-07-24 DIAGNOSIS — I651 Occlusion and stenosis of basilar artery: Secondary | ICD-10-CM | POA: Diagnosis not present

## 2023-07-24 MED ORDER — IOHEXOL 350 MG/ML SOLN
75.0000 mL | Freq: Once | INTRAVENOUS | Status: AC | PRN
  Administered 2023-07-24: 75 mL via INTRAVENOUS

## 2023-07-24 MED ORDER — IOPAMIDOL (ISOVUE-300) INJECTION 61%: 100.0000 mL | Freq: Once | INTRAVENOUS | Status: DC | PRN

## 2023-07-26 DIAGNOSIS — K080 Exfoliation of teeth due to systemic causes: Secondary | ICD-10-CM | POA: Diagnosis not present

## 2023-07-30 ENCOUNTER — Encounter: Payer: Self-pay | Admitting: Family Medicine

## 2023-07-30 ENCOUNTER — Other Ambulatory Visit: Payer: Self-pay | Admitting: Family Medicine

## 2023-07-30 DIAGNOSIS — I6501 Occlusion and stenosis of right vertebral artery: Secondary | ICD-10-CM

## 2023-07-30 DIAGNOSIS — I6523 Occlusion and stenosis of bilateral carotid arteries: Secondary | ICD-10-CM

## 2023-07-30 DIAGNOSIS — Z8673 Personal history of transient ischemic attack (TIA), and cerebral infarction without residual deficits: Secondary | ICD-10-CM

## 2023-08-04 ENCOUNTER — Ambulatory Visit (HOSPITAL_COMMUNITY): Payer: Medicare Other | Attending: Family Medicine

## 2023-08-04 DIAGNOSIS — G459 Transient cerebral ischemic attack, unspecified: Secondary | ICD-10-CM | POA: Insufficient documentation

## 2023-08-04 LAB — ECHOCARDIOGRAM COMPLETE
Area-P 1/2: 2.91 cm2
S' Lateral: 3.1 cm

## 2023-08-05 ENCOUNTER — Telehealth: Payer: Self-pay

## 2023-08-05 MED ORDER — ATORVASTATIN CALCIUM 40 MG PO TABS: 40.0000 mg | ORAL_TABLET | Freq: Every day | ORAL | 1 refills | Status: DC

## 2023-08-05 NOTE — Telephone Encounter (Signed)
Copied from CRM [id number]. Topic: Clinical - Medical Advice >> Aug 02, 2023 12:46 PM Nila Nephew wrote: Reason for CRM: Patient states that he is absolutely agreeable to a vascular surgeon and patient is agreeable to starting the atorvastatin. Patient states wife tried a statin and had memory problems so it concerns him a little bit but he is willing to start that.   Referral can absolutely leave a message.

## 2023-08-06 ENCOUNTER — Encounter: Payer: Self-pay | Admitting: Family Medicine

## 2023-08-16 ENCOUNTER — Telehealth: Payer: Self-pay | Admitting: Family Medicine

## 2023-08-16 NOTE — Telephone Encounter (Signed)
 Left patient a voicemail with Vascular surgery information.

## 2023-08-16 NOTE — Telephone Encounter (Signed)
 Thank you :)

## 2023-08-16 NOTE — Telephone Encounter (Signed)
 Copied from CRM [id number]. Topic: Referral - Status >> Aug 13, 2023 11:36 AM Nila Nephew wrote: Reason for CRM: Patient calling in to request an update on his vascular surgery referral. Patient requesting a call regarding where the referral was sent and what their contact information is, as he needs to see them sooner rather than later.

## 2023-08-25 ENCOUNTER — Telehealth: Payer: Self-pay | Admitting: Family Medicine

## 2023-08-25 NOTE — Telephone Encounter (Signed)
 Copied from CRM [id number]. Topic: Referral - Question >> Aug 25, 2023 11:53 AM Marlow Baars wrote: Reason for CRM: The patient called in stating he checked on his referral [facility]. He called and they told him it was sent to the wrong location. The referral was sent to the lab. Can it please be resubmitted to the right location as soon as possible because he needs this surgery as soon as possible but needs to see them first.

## 2023-08-25 NOTE — Telephone Encounter (Signed)
 It has been corrected,Thanks

## 2023-08-27 ENCOUNTER — Telehealth: Payer: Self-pay | Admitting: Family Medicine

## 2023-08-27 DIAGNOSIS — I679 Cerebrovascular disease, unspecified: Secondary | ICD-10-CM

## 2023-08-27 DIAGNOSIS — G459 Transient cerebral ischemic attack, unspecified: Secondary | ICD-10-CM

## 2023-08-27 DIAGNOSIS — I6509 Occlusion and stenosis of unspecified vertebral artery: Secondary | ICD-10-CM

## 2023-08-27 NOTE — Telephone Encounter (Signed)
 Copied from CRM [id number]. Topic: Referral - Status >> Aug 27, 2023  2:00 PM Robert Hodges wrote: Reason for CRM: Patient stated that he has not heard form anyone regarding the referral for vascular and vein specialist. Patient reports that he reached out to the vascular and vein center and he was told that they had not received a referral. Patient was told the referral was sent to the lab. Patient requests that the referral be sent asap so he can be scheduled.

## 2023-08-30 NOTE — Telephone Encounter (Signed)
 Called [facility] and left a voicemail.

## 2023-08-30 NOTE — Telephone Encounter (Signed)
 Noted.

## 2023-08-30 NOTE — Telephone Encounter (Signed)
 Forwarding as I do not know what the next steps are for this patient.  Thanks, Ander Slade

## 2023-08-30 NOTE — Telephone Encounter (Signed)
 Below was the response of the clinic. Please advise   "I am reviewing the referral sent [facility] for [facility] Hodges.   We do not treat intracranial disease. We treat extracranial.   Looks like the CT angio of the neck was normal but please let me know if I am missing something, otherwise I will close this referral.   Thank you,   Alcario Drought "

## 2023-09-13 DIAGNOSIS — I6501 Occlusion and stenosis of right vertebral artery: Secondary | ICD-10-CM | POA: Diagnosis not present

## 2023-09-15 ENCOUNTER — Ambulatory Visit: Admission: EM | Admit: 2023-09-15 | Discharge: 2023-09-15 | Disposition: A | Discharge status: Home / Self Care

## 2023-09-15 ENCOUNTER — Other Ambulatory Visit: Payer: Self-pay

## 2023-09-15 DIAGNOSIS — J019 Acute sinusitis, unspecified: Secondary | ICD-10-CM | POA: Diagnosis not present

## 2023-09-15 DIAGNOSIS — R058 Other specified cough: Secondary | ICD-10-CM | POA: Diagnosis not present

## 2023-09-15 MED ORDER — DOXYCYCLINE HYCLATE 100 MG PO CAPS: 100.0000 mg | ORAL_CAPSULE | Freq: Two times a day (BID) | ORAL | 0 refills | Status: DC

## 2023-09-15 MED ORDER — PREDNISONE 20 MG PO TABS: 40.0000 mg | ORAL_TABLET | Freq: Every day | ORAL | 0 refills | Status: DC

## 2023-09-15 NOTE — ED Triage Notes (Signed)
 Has been ill for 3 weeks, has c/o sore throat, runny nose, sneezing, drainage from eyes, cough, ears itching, lack of energy. Has taken nyquil.

## 2023-09-15 NOTE — ED Provider Notes (Signed)
 [facility]    CSN: [id number] Arrival date & time: 09/15/23  1207      History   Chief Complaint Chief Complaint  Patient presents with   Cough   Sore Throat    HPI FREDIE Hodges is a 73 y.o. male.   HPI  Patient states daughter and 72-year-old granddaughter are living with them at this time.  The granddaughter always seems to have a cough.  Robert Hodges states that he has had cough and cold symptoms for 3 weeks now.  He states he has postnasal drip sinus congestion and nasal congestion, sore throat.  Some ear pressure and pain.  He also has a cough and is coughing up a lot of sputum.  Sometimes his chest feels very tight.  He states the cough also is keeping him awake at night.  No chest pain.  No fever or chills.  He states that he is feeling somewhat tired.  He does not have underlying asthma or lung disease  Past Medical History:  Diagnosis Date   GERD (gastroesophageal reflux disease)    control with meds   Neuromuscular disorder (HCC) 2010   Pudendal Neuralgia   Seizure disorder [facility])     Patient Active Problem List   Diagnosis Date Noted   TIA (transient ischemic attack) 07/15/2023   Left ankle pain 10/22/2022   Well adult exam 03/04/2021   Colon polyp 11/02/2016   GERD (gastroesophageal reflux disease) 10/23/2015   Secondary hyperparathyroidism (HCC) 10/01/2015   External resorption of root of tooth 09/25/2015   Hyperlipidemia 06/20/2008   IMPAIRED FASTING GLUCOSE 03/16/2007   Epilepsy (HCC) 01/18/2007    Past Surgical History:  Procedure Laterality Date   EYE SURGERY     Lasik   pudendal nerve  pudendal nerve    surgery   SPINE SURGERY  07/24/2022   Lumbar Micro-discectomy       Home Medications    Prior to Admission medications   Medication Sig Start Date End Date Taking? Authorizing Provider  aspirin 81 MG chewable tablet Chew by mouth daily.   Yes [provider]  doxycycline (VIBRAMYCIN) 100 MG capsule Take 1 capsule (100 mg  total) by mouth 2 (two) times daily. 09/15/23  Yes Eustace Moore, MD  predniSONE (DELTASONE) 20 MG tablet Take 2 tablets (40 mg total) by mouth daily with breakfast. 09/15/23  Yes Eustace Moore, MD  atorvastatin (LIPITOR) 40 MG tablet Take 1 tablet (40 mg total) by mouth daily. 08/05/23   Everrett Coombe, DO  carbamazepine (TEGRETOL) 200 MG tablet Take 1 tablet (200 mg total) by mouth daily. 03/09/23   Everrett Coombe, DO  Cholecalciferol (VITAMIN D) 50 MCG (2000 UT) CAPS Take 1 capsule by mouth daily.    [provider]  famotidine (PEPCID) 20 MG tablet Take 1 tablet (20 mg total) by mouth 2 (two) times daily. 03/09/23   Everrett Coombe, DO  metroNIDAZOLE (METROGEL) 0.75 % gel Apply topically 2 (two) times daily. 03/05/22   Everrett Coombe, DO    Family History Family History  Problem Relation Age of Onset   Heart failure Mother     Social History Social History   Tobacco Use   Smoking status: Former   Smokeless tobacco: Never  Vaping Use   Vaping status: Never Used  Substance Use Topics   Alcohol use: Yes    Alcohol/week: 3.0 - 4.0 standard drinks of alcohol    Types: 3 - 4 Standard drinks or equivalent per week  Comment: occassional   Drug use: Not Currently    Types: Other-see comments    Comment: cbd gummies     Allergies   Patient has no known allergies.   Review of Systems Review of Systems  See HPI Physical Exam Triage Vital Signs ED Triage Vitals  Encounter Vitals Group     BP 09/15/23 1214 137/74     Systolic BP Percentile --      Diastolic BP Percentile --      Pulse Rate 09/15/23 1214 97     Resp 09/15/23 1214 16     Temp 09/15/23 1214 99.1 F (37.3 C)     Temp src --      SpO2 09/15/23 1214 97 %     Weight --      Height --      Head Circumference --      Peak Flow --      Pain Score 09/15/23 1217 2     Pain Loc --      Pain Education --      Exclude from Growth Chart --    No data found.  Updated Vital Signs BP 137/74   Pulse  97   Temp 99.1 F (37.3 C)   Resp 16   SpO2 97%      Physical Exam Constitutional:      General: He is not in acute distress.    Appearance: He is well-developed. He is ill-appearing.     Comments: Appears tired  HENT:     Head: Normocephalic and atraumatic.     Right Ear: Tympanic membrane and ear canal normal.     Left Ear: Tympanic membrane and ear canal normal.     Nose: Congestion present. No rhinorrhea.     Mouth/Throat:     Mouth: Mucous membranes are moist.     Pharynx: Posterior oropharyngeal erythema present. No pharyngeal swelling.     Tonsils: No tonsillar exudate.     Comments: Mild erythema posterior pharynx.  Mild erythema of posterior soft palate and uvula.  No swelling Eyes:     Conjunctiva/sclera: Conjunctivae normal.     Pupils: Pupils are equal, round, and reactive to light.  Cardiovascular:     Rate and Rhythm: Normal rate and regular rhythm.     Heart sounds: Normal heart sounds.  Pulmonary:     Effort: Pulmonary effort is normal. No respiratory distress.     Breath sounds: Rhonchi present.  Abdominal:     General: There is no distension.     Palpations: Abdomen is soft.  Musculoskeletal:        General: Normal range of motion.     Cervical back: Normal range of motion.  Lymphadenopathy:     Cervical: No cervical adenopathy.  Skin:    General: Skin is warm and dry.  Neurological:     Mental Status: He is alert.      UC Treatments / Results  Labs (all labs ordered are listed, but only abnormal results are displayed) Labs Reviewed - No data to display  EKG   Radiology No results found.  Procedures Procedures (including critical care time)  Medications Ordered in UC Medications - No data to display  Initial Impression / Assessment and Plan / UC Course  I have reviewed the triage vital signs and the nursing notes.  Pertinent labs & imaging results that were available during my care of the patient were reviewed by me and considered  in my medical  decision making (see chart for details).     Because symptoms have been lasting for 3 weeks we will add an antibiotic.  Prednisone for the wheezing and chest tightness.  Follow-up with PCP if fails to improve Final Clinical Impressions(s) / UC Diagnoses   Final diagnoses:  Acute sinusitis with symptoms greater than 10 days  Productive cough     Discharge Instructions      Take doxycycline 2 times a day.  It is important to take your doxycycline with food Take the prednisone once a day for 5 days Drink lots of water May continue over-the-counter cough or cold medicines as needed See your doctor if not improving in a week   ED Prescriptions     Medication Sig Dispense Auth. Provider   doxycycline (VIBRAMYCIN) 100 MG capsule Take 1 capsule (100 mg total) by mouth 2 (two) times daily. 20 capsule Eustace Moore, MD   predniSONE (DELTASONE) 20 MG tablet Take 2 tablets (40 mg total) by mouth daily with breakfast. 10 tablet Eustace Moore, MD      PDMP not reviewed this encounter.   Eustace Moore, MD 09/15/23 1239

## 2023-09-15 NOTE — Discharge Instructions (Signed)
 Take doxycycline 2 times a day.  It is important to take your doxycycline with food Take the prednisone once a day for 5 days Drink lots of water May continue over-the-counter cough or cold medicines as needed See your doctor if not improving in a week

## 2023-10-26 DIAGNOSIS — K080 Exfoliation of teeth due to systemic causes: Secondary | ICD-10-CM | POA: Diagnosis not present

## 2023-11-02 DIAGNOSIS — K080 Exfoliation of teeth due to systemic causes: Secondary | ICD-10-CM | POA: Diagnosis not present

## 2023-11-17 ENCOUNTER — Ambulatory Visit: Payer: Medicare Other

## 2023-11-17 VITALS — Ht 71.0 in | Wt 174.0 lb

## 2023-11-17 DIAGNOSIS — Z0000 Encounter for general adult medical examination without abnormal findings: Secondary | ICD-10-CM | POA: Diagnosis not present

## 2023-11-17 NOTE — Patient Instructions (Signed)
  Mr. Dutter , Thank you for taking time to come for your Medicare Wellness Visit. I appreciate your ongoing commitment to your health goals. Please review the following plan we discussed and let me know if I can assist you in the future.   These are the goals we discussed:  Goals       Patient Stated      Lose a few lbs      Patient Stated (pt-stated)      Patient stated that he would like to continue to maintain his currently healthy lifestyle.      Patient Stated      Patient states he would like continue a healthy lifestyle.         This is a list of the screening recommended for you and due dates:  Health Maintenance  Topic Date Due   COVID-19 Vaccine (1) Never done   Zoster (Shingles) Vaccine (1 of 2) Never done   Colon Cancer Screening  11/05/2021   Pneumonia Vaccine (1 of 1 - PCV) 03/23/2024*   DTaP/Tdap/Td vaccine (3 - Td or Tdap) 01/13/2024   Flu Shot  01/14/2024   Medicare Annual Wellness Visit  11/16/2024   Hepatitis C Screening  Completed   HPV Vaccine  Aged Out   Meningitis B Vaccine  Aged Out  *Topic was postponed. The date shown is not the original due date.

## 2023-11-17 NOTE — Progress Notes (Signed)
 Subjective:   Robert Hodges is a 73 y.o. male who presents for Medicare Annual/Subsequent preventive examination.  Visit Complete: Virtual I connected with  Robert Hodges on 11/17/23 by a audio enabled telemedicine application and verified that I am speaking with the correct person using two identifiers.  Patient Location: Home  Provider Location: Office/Clinic  I discussed the limitations of evaluation and management by telemedicine. The patient expressed understanding and agreed to proceed.  Vital Signs: Because this visit was a virtual/telehealth visit, some criteria may be missing or patient reported. Any vitals not documented were not able to be obtained and vitals that have been documented are patient reported.  Patient Medicare AWV questionnaire was completed by the patient on n/a; I have confirmed that all information answered by patient is correct and no changes since this date.  Cardiac Risk Factors include: advanced age (>52men, >44 women);male gender;smoking/ tobacco exposure     Objective:    Today's Vitals   11/17/23 1102  Weight: 174 lb (78.9 kg)  Height: 5\' 11"  (1.803 m)   Body mass index is 24.27 kg/m.     11/17/2023   11:15 AM 11/12/2022   11:10 AM 06/04/2022   10:21 AM 02/22/2021   11:13 AM  Advanced Directives  Does Patient Have a Medical Advance Directive? No Yes No No  Type of Advance Directive  Healthcare Power of Attorney    Does patient want to make changes to medical advance directive?  No - Patient declined    Copy of Healthcare Power of Attorney in Chart?  No - copy requested    Would patient like information on creating a medical advance directive? No - Patient declined  No - Patient declined     Current Medications (verified) Outpatient Encounter Medications as of 11/17/2023  Medication Sig   aspirin 81 MG chewable tablet Chew by mouth daily.   atorvastatin  (LIPITOR) 40 MG tablet Take 1 tablet (40 mg total) by mouth daily.   carbamazepine   (TEGRETOL ) 200 MG tablet Take 1 tablet (200 mg total) by mouth daily.   Cholecalciferol (VITAMIN D ) 50 MCG (2000 UT) CAPS Take 1 capsule by mouth daily.   famotidine  (PEPCID ) 20 MG tablet Take 1 tablet (20 mg total) by mouth 2 (two) times daily.   metroNIDAZOLE  (METROGEL ) 0.75 % gel Apply topically 2 (two) times daily.   [DISCONTINUED] doxycycline  (VIBRAMYCIN ) 100 MG capsule Take 1 capsule (100 mg total) by mouth 2 (two) times daily.   [DISCONTINUED] predniSONE  (DELTASONE ) 20 MG tablet Take 2 tablets (40 mg total) by mouth daily with breakfast.   No facility-administered encounter medications on file as of 11/17/2023.    Allergies (verified) Patient has no known allergies.   History: Past Medical History:  Diagnosis Date   GERD (gastroesophageal reflux disease)    control with meds   Neuromuscular disorder (HCC) 2010   Pudendal Neuralgia   Seizure disorder [facility])    Past Surgical History:  Procedure Laterality Date   EYE SURGERY     Lasik   pudendal nerve  pudendal nerve    surgery   SPINE SURGERY  07/24/2022   Lumbar Micro-discectomy   Family History  Problem Relation Age of Onset   Heart failure Mother    Social History   Socioeconomic History   Marital status: Married    Spouse name: Tammy   Number of children: 4   Years of education: 14   Highest education level: Associate degree: occupational, Scientist, product/process development, or vocational program  Occupational History   Occupation: Retired  Tobacco Use   Smoking status: Never   Smokeless tobacco: Never  Vaping Use   Vaping status: Never Used  Substance and Sexual Activity   Alcohol use: Yes    Alcohol/week: 3.0 - 4.0 standard drinks of alcohol    Types: 3 - 4 Standard drinks or equivalent per week    Comment: occassional   Drug use: Not Currently    Types: Other-see comments    Comment: cbd gummies   Sexual activity: Not Currently    Partners: Female  Other Topics Concern   Not on file  Social History Narrative   Lives with  his wife, son, daughter and grand-daughter. He has four children. He enjoys bowling, hiking, walking and going to the gun range.    Social Drivers of Corporate investment banker Strain: Low Risk  (11/17/2023)   Overall Financial Resource Strain (CARDIA)    Difficulty of Paying Living Expenses: Not hard at all  Food Insecurity: No Food Insecurity (11/17/2023)   Hunger Vital Sign    Worried About Running Out of Food in the Last Year: Never true    Ran Out of Food in the Last Year: Never true  Transportation Needs: No Transportation Needs (11/17/2023)   PRAPARE - Administrator, Civil Service (Medical): No    Lack of Transportation (Non-Medical): No  Physical Activity: Sufficiently Active (11/17/2023)   Exercise Vital Sign    Days of Exercise per Week: 5 days    Minutes of Exercise per Session: 30 min  Stress: No Stress Concern Present (11/17/2023)   [employer] of Occupational Health - Occupational Stress Questionnaire    Feeling of Stress : Not at all  Social Connections: Moderately Integrated (11/17/2023)   Social Connection and Isolation Panel [NHANES]    Frequency of Communication with Friends and Family: Never    Frequency of Social Gatherings with Friends and Family: Once a week    Attends Religious Services: More than 4 times per year    Active Member of [employer] or Organizations: Yes    Attends Engineer, structural: More than 4 times per year    Marital Status: Married    Tobacco Counseling Counseling given: Not Answered   Clinical Intake:  Pre-visit preparation completed: Yes  Pain : No/denies pain     BMI - recorded: 24.27 Nutritional Status: BMI of 19-24  Normal Nutritional Risks: None Diabetes: No  How often do you need to have someone help you when you read instructions, pamphlets, or other written materials from your doctor or pharmacy?: 1 - Never What is the last grade level you completed in school?: 13  Interpreter Needed?: No       Activities of Daily Living    11/17/2023   11:04 AM  In your present state of health, do you have any difficulty performing the following activities:  Hearing? 0  Vision? 0  Difficulty concentrating or making decisions? 0  Walking or climbing stairs? 1  Dressing or bathing? 0  Doing errands, shopping? 0  Preparing Food and eating ? N  Using the Toilet? N  In the past six months, have you accidently leaked urine? N  Do you have problems with loss of bowel control? N  Managing your Medications? N  Managing your Finances? N  Housekeeping or managing your Housekeeping? N    Patient Care Team: Adela Holter, DO as PCP - General (Family Medicine) Audie Bleacher, MD as Consulting Physician (  Neurosurgery)  Indicate any recent Medical Services you may have received from other than [facility] providers in the past year (date may be approximate).     Assessment:   This is a routine wellness examination for Robert Hodges.  Hearing/Vision screen No results found.   Goals Addressed             This Visit's Progress    Patient Stated       Patient states he would like continue a healthy lifestyle.       Depression Screen    11/17/2023   11:14 AM 11/12/2022   11:05 AM 10/22/2022    1:14 PM 03/05/2022   10:26 AM 03/04/2021    1:30 PM 02/22/2021   11:11 AM 08/12/2017   11:00 AM  PHQ 2/9 Scores  PHQ - 2 Score 0 0 0 0 0 0 0  PHQ- 9 Score       1    Fall Risk    11/17/2023   11:15 AM 03/09/2023    9:31 AM 11/12/2022   11:05 AM 11/08/2022    2:44 PM 10/22/2022    1:14 PM  Fall Risk   Falls in the past year? 1 0 0 0 0  Number falls in past yr: 0 0 0 0 0  Injury with Fall? 0 0 0 0 0  Risk for fall due to : No Fall Risks No Fall Risks No Fall Risks  No Fall Risks  Follow up Falls evaluation completed Falls evaluation completed Falls evaluation completed  Falls evaluation completed    MEDICARE RISK AT HOME: Medicare Risk at Home Any stairs in or around the home?: No If so, are there any  without handrails?: No Home free of loose throw rugs in walkways, pet beds, electrical cords, etc?: Yes Adequate lighting in your home to reduce risk of falls?: Yes Life alert?: No Use of a cane, walker or w/c?: No Grab bars in the bathroom?: Yes Shower chair or bench in shower?: Yes Elevated toilet seat or a handicapped toilet?: No  TIMED UP AND GO:  Was the test performed?  No    Cognitive Function:        11/17/2023   11:17 AM 11/12/2022   11:10 AM 02/22/2021   11:19 AM  6CIT Screen  What Year? 0 points 0 points 0 points  What month? 0 points 0 points 0 points  What time? 0 points 0 points 0 points  Count back from 20 0 points 0 points 0 points  Months in reverse 0 points 0 points 0 points  Repeat phrase 0 points 0 points 0 points  Total Score 0 points 0 points 0 points    Immunizations Immunization History  Administered Date(s) Administered   Td 06/30/2010   Tdap 01/12/2014    TDAP status: Up to date  Flu Vaccine status: Declined, Education has been provided regarding the importance of this vaccine but patient still declined. Advised may receive this vaccine at local pharmacy or Health Dept. Aware to provide a copy of the vaccination record if obtained from local pharmacy or Health Dept. Verbalized acceptance and understanding.  Pneumococcal vaccine status: Declined,  Education has been provided regarding the importance of this vaccine but patient still declined. Advised may receive this vaccine at local pharmacy or Health Dept. Aware to provide a copy of the vaccination record if obtained from local pharmacy or Health Dept. Verbalized acceptance and understanding.   Covid-19 vaccine status: Declined, Education has been provided regarding the  importance of this vaccine but patient still declined. Advised may receive this vaccine at local pharmacy or Health Dept.or vaccine clinic. Aware to provide a copy of the vaccination record if obtained from local pharmacy or Health  Dept. Verbalized acceptance and understanding.  Qualifies for Shingles Vaccine? Yes   Zostavax completed No   Shingrix Completed?: No.    Education has been provided regarding the importance of this vaccine. Patient has been advised to call insurance company to determine out of pocket expense if they have not yet received this vaccine. Advised may also receive vaccine at local pharmacy or Health Dept. Verbalized acceptance and understanding.  Screening Tests Health Maintenance  Topic Date Due   COVID-19 Vaccine (1) Never done   Zoster Vaccines- Shingrix (1 of 2) Never done   Colonoscopy  11/05/2021   Pneumonia Vaccine 5+ Years old (1 of 1 - PCV) 03/23/2024 (Originally 01/07/1999)   DTaP/Tdap/Td (3 - Td or Tdap) 01/13/2024   INFLUENZA VACCINE  01/14/2024   Medicare Annual Wellness (AWV)  11/16/2024   Hepatitis C Screening  Completed   HPV VACCINES  Aged Out   Meningococcal B Vaccine  Aged Out    Health Maintenance  Health Maintenance Due  Topic Date Due   COVID-19 Vaccine (1) Never done   Zoster Vaccines- Shingrix (1 of 2) Never done   Colonoscopy  11/05/2021    Colorectal cancer screening: No longer required.   Lung Cancer Screening: (Low Dose CT Chest recommended if Age 76-80 years, 20 pack-year currently smoking OR have quit w/in 15years.) does not qualify.   Lung Cancer Screening Referral: n/a  Additional Screening:  Hepatitis C Screening: does qualify; Completed 02/26/2021  Vision Screening: Recommended annual ophthalmology exams for early detection of glaucoma and other disorders of the eye. Is the patient up to date with their annual eye exam?  Yes  Who is the provider or what is the name of the office in which the patient attends annual eye exams? Dr April Knack If pt is not established with a provider, would they like to be referred to a provider to establish care? N/a.   Dental Screening: Recommended annual dental exams for proper oral hygiene   Community Resource  Referral / Chronic Care Management: CRR required this visit?  No   CCM required this visit?  No     Plan:     I have personally reviewed and noted the following in the patient's chart:   Medical and social history Use of alcohol, tobacco or illicit drugs  Current medications and supplements including opioid prescriptions. Patient is not currently taking opioid prescriptions. Functional ability and status Nutritional status Physical activity Advanced directives List of other physicians Hospitalizations, surgeries, and ER visits in previous 12 months. None Vitals Screenings to include cognitive, depression, and falls Referrals and appointments  In addition, I have reviewed and discussed with patient certain preventive protocols, quality metrics, and best practice recommendations. A written personalized care plan for preventive services as well as general preventive health recommendations were provided to patient.     Aubrey Leaf, CMA   11/17/2023   After Visit Summary: (MyChart) Due to this being a telephonic visit, the after visit summary with patients personalized plan was offered to patient via MyChart   Nurse Notes:   Robert Hodges is a 73 y.o. male patient of Adela Holter, DO who had a Medicare Annual Wellness Visit today via telephone. Mischa is Retired and lives with their family. he has 4 children.  He reports that he is socially active and does interact with friends/family regularly.  He is moderately physically active and enjoys bowling and going to the gun range.

## 2023-12-06 ENCOUNTER — Ambulatory Visit: Admitting: Neurology

## 2023-12-06 ENCOUNTER — Encounter: Payer: Self-pay | Admitting: Neurology

## 2023-12-06 VITALS — BP 124/78 | HR 60 | Ht 71.0 in | Wt 183.6 lb

## 2023-12-06 DIAGNOSIS — Z8669 Personal history of other diseases of the nervous system and sense organs: Secondary | ICD-10-CM

## 2023-12-06 DIAGNOSIS — I639 Cerebral infarction, unspecified: Secondary | ICD-10-CM | POA: Diagnosis not present

## 2023-12-06 DIAGNOSIS — I6501 Occlusion and stenosis of right vertebral artery: Secondary | ICD-10-CM | POA: Diagnosis not present

## 2023-12-06 DIAGNOSIS — E7849 Other hyperlipidemia: Secondary | ICD-10-CM

## 2023-12-06 DIAGNOSIS — R7303 Prediabetes: Secondary | ICD-10-CM

## 2023-12-06 NOTE — Progress Notes (Signed)
 [facility] [address] [place]. [place] [address] [phone]       OFFICE CONSULT NOTE  [name] Date of Birth:  01/13/1951 Medical Record Number:  [id number]   Referring MD:  Rockey Grow  Reason for Referral:   Vertebral artery occlusion  HPI: Robert Hodges is a 73 year old Caucasian male seen today for initial office consultation visit for vertebral artery occlusion.  History is obtained from the patient and review of electronic medical records.  I personally reviewed pertinent available imaging films in PACS.  He has past medical history of remote seizures, gastroesophageal reflux disease and hyperlipidemia.  He states that 1 day towards the end of January he woke up feeling dizzy off balance and staggering to the right.  He denied any nausea vomiting headache double vision or vertigo.  He states his imbalance and walking improved over 2 to 3 days.  He saw his primary care physician who ordered an MRI which was done almost a week later on 07/18/2023 which showed no acute stroke but showed mild changes of chronic small vessel disease.  Right vertebral artery flow void was absent.  He subsequently underwent CT angiogram of the brain and neck on 07/24/2023 which showed right vertebral artery occlusion in the V4 segment and 50% bilateral cavernous carotid stenosis.  There is no significant stenosis noted in the neck.  Lab work on 03/09/2023 had shown hemoglobin A1c to be 6.7 and LDL cholesterol to be 162 mg percent.  Echocardiogram in 08/04/2023 had shown normal ejection fraction of 55 to 60%.  With normal left ventricular function and left atrial size.  There is no evidence of intra-atrial shunt.  Patient had no known prior history of stroke  or TIAs.Robert Hodges  He gives a remote history of seizures more than 20 years ago for which he was on Tegretol .  He has had no breakthrough seizures in the last 2 decades but takes Tegretol  only 200 mg once a day.  Tolerating it well without any  side effects.  He has been started on aspirin 81 mg daily which is tolerating well with minor bruising and no bleeding.  His blood pressure is under good control today it is 129/78.  He is tolerating Lipitor 40 mg well without any side effects.  He has not had any follow-up lipid profile hemoglobin A1c checked.  He denies any history of palpitations or cardiac arrhythmias or syncopal episodes.  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   Seizure disorder (HCC)     Social History:  Social History   Socioeconomic History   Marital status: Married    Spouse name: Robert Hodges   Number of children: 4   Years of education: 14   Highest education level: Associate degree: occupational, Scientist, product/process development, or vocational program  Occupational History   Occupation: Retired  Tobacco Use   Smoking status: Never   Smokeless tobacco: Never  Vaping Use   Vaping status: Never Used  Substance and Sexual Activity   Alcohol use: Yes    Alcohol/week: 3.0 - 4.0 standard drinks of alcohol    Types: 3 - 4 Standard drinks or equivalent per week    Comment: occassional   Drug use: Not Currently    Types:  Other-see comments    Comment: cbd gummies   Sexual activity: Not Currently    Partners: Female  Other Topics Concern   Not on file  Social History Narrative   Lives with his wife, son, daughter and grand-daughter. He has four children. He enjoys bowling, hiking, walking and going to the gun range.    Social Drivers of Corporate investment banker Strain: Low Risk  (11/17/2023)   Overall Financial Resource Strain (CARDIA)    Difficulty of Paying Living Expenses: Not hard at all  Food Insecurity: No Food Insecurity (11/17/2023)   Hunger Vital Sign    Worried About  Running Out of Food in the Last Year: Never true    Ran Out of Food in the Last Year: Never true  Transportation Needs: No Transportation Needs (11/17/2023)   PRAPARE - Administrator, Civil Service (Medical): No    Lack of Transportation (Non-Medical): No  Physical Activity: Sufficiently Active (11/17/2023)   Exercise Vital Sign    Days of Exercise per Week: 5 days    Minutes of Exercise per Session: 30 min  Stress: No Stress Concern Present (11/17/2023)   [employer] of Occupational Health - Occupational Stress Questionnaire    Feeling of Stress : Not at all  Social Connections: Moderately Integrated (11/17/2023)   Social Connection and Isolation Panel    Frequency of Communication with Friends and Family: Never    Frequency of Social Gatherings with Friends and Family: Once a week    Attends Religious Services: More than 4 times per year    Active Member of [employer] or Organizations: Yes    Attends Engineer, structural: More than 4 times per year    Marital Status: Married  Catering manager Violence: Not At Risk (11/17/2023)   Humiliation, Afraid, Rape, and Kick questionnaire    Fear of Current or Ex-Partner: No    Emotionally Abused: No    Physically Abused: No    Sexually Abused: No    Medications:   Current Outpatient Medications on File Prior to Visit  Medication Sig Dispense Refill   aspirin 81 MG chewable tablet Chew by mouth daily.     atorvastatin  (LIPITOR) 40 MG tablet Take 1 tablet (40 mg total) by mouth daily. 90 tablet 1   carbamazepine  (TEGRETOL ) 200 MG tablet Take 1 tablet (200 mg total) by mouth daily. 90 tablet 3   Cholecalciferol (VITAMIN D ) 50 MCG (2000 UT) CAPS Take 1 capsule by mouth daily.     metroNIDAZOLE  (METROGEL ) 0.75 % gel Apply topically 2 (two) times daily. 45 g 3   famotidine  (PEPCID ) 20 MG tablet Take 1 tablet (20 mg total) by mouth 2 (two) times daily. (Patient not taking: Reported on 12/06/2023) 180 tablet 3   No current  facility-administered medications on file prior to visit.    Allergies:  No Known Allergies  Physical Exam General: well developed, well nourished, seated, in no evident distress Head: head normocephalic and atraumatic.   Neck: supple with no carotid or supraclavicular bruits Cardiovascular: regular rate and rhythm, no murmurs Musculoskeletal: no deformity Skin:  no rash/petichiae Vascular:  Normal pulses all extremities  Neurologic Exam Mental Status: Awake and fully alert. Oriented to place and time. Recent and remote memory intact. Attention span, concentration and fund of knowledge appropriate. Mood and affect appropriate.  Cranial Nerves: Fundoscopic exam reveals sharp disc margins. Pupils equal, briskly reactive to light. Extraocular movements full without nystagmus. Visual fields full to confrontation. Hearing  intact. Facial sensation intact. Face, tongue, palate moves normally and symmetrically.  Motor: Normal bulk and tone. Normal strength in all tested extremity muscles. Sensory.: intact to touch , pinprick , position and vibratory sensation.  Coordination: Rapid alternating movements normal in all extremities. Finger-to-nose and heel-to-shin performed accurately bilaterally. Gait and Station: Arises from chair without difficulty. Stance is normal. Gait demonstrates normal stride length and balance . Able to heel, toe and tandem walk without difficulty.  Reflexes: 1+ and symmetric. Toes downgoing.   NIHSS  0 Modified Rankin  0   ASSESSMENT: 73 year old Caucasian male episode of dizziness gait ataxia and right-sided incoordination and January 2025 likely a small cerebellar infarct due to terminal right vertebral artery occlusion.  MRI was negative but was done about a week after his symptoms had resolved and may have missed a small stroke..  Vascular risk factors of hyperlipidemia, borderline diabetes and intracranial atherosclerotic occlusive disease.     PLAN:I had a long  d/w patient about his recent  suspected small cerebellar stroke,right vertebral artery occlusion, risk for recurrent stroke/TIAs, personally independently reviewed imaging studies and stroke evaluation results and answered questions.Continue aspirin 81 mg daily  for secondary stroke prevention and maintain strict control of hypertension with blood pressure goal below 130/90, diabetes with hemoglobin A1c goal below 6.5% and lipids with LDL cholesterol goal below 70 mg/dL. I also advised the patient to eat a healthy diet with plenty of whole grains, cereals, fruits and vegetables, exercise regularly and maintain ideal body weight .check lipid profile, hemoglobin A1c and 30-day heart monitor for paroxysmal A-fib.  Check EEG for any irritability in the brain and if negative may consider tapering and stopping his Tegretol  as he has not had a seizure in more than 20 years.  Followup in the future with me in 6 months or call earlier if needed. Greater than 50% time during this 45-minute consultation visit was spent in counseling and coordination of care about his small cerebellar stroke, terminal right vertebral artery occlusion and remote history of seizures discussion and answering questions. Eather Popp, MD Note: This document was prepared with digital dictation and possible smart phrase technology. Any transcriptional errors that result from this process are unintentional.

## 2023-12-06 NOTE — Patient Instructions (Addendum)
 I had a long d/w patient about his recent  suspected small cerebellar stroke,right vertebral artery occlusion, risk for recurrent stroke/TIAs, personally independently reviewed imaging studies and stroke evaluation results and answered questions.Continue aspirin 81 mg daily  for secondary stroke prevention and maintain strict control of hypertension with blood pressure goal below 130/90, diabetes with hemoglobin A1c goal below 6.5% and lipids with LDL cholesterol goal below 70 mg/dL. I also advised the patient to eat a healthy diet with plenty of whole grains, cereals, fruits and vegetables, exercise regularly and maintain ideal body weight .check lipid profile, hemoglobin A1c and 30-day heart monitor for paroxysmal A-fib.  Check EEG for any irritability in the brain and if negative may consider tapering and stopping his Tegretol  as he has not had a seizure in more than 20 years.  Followup in the future with me in 6 months or call earlier if needed.  Stroke Prevention Some medical conditions and behaviors can lead to a higher chance of having a stroke. You can help prevent a stroke by eating healthy, exercising, not smoking, and managing any medical conditions you have. Stroke is a leading cause of functional impairment. Primary prevention is particularly important because a majority of strokes are first-time events. Stroke changes the lives of not only those who experience a stroke but also their family and other caregivers. How can this condition affect me? A stroke is a medical emergency and should be treated right away. A stroke can lead to brain damage and can sometimes be life-threatening. If a person gets medical treatment right away, there is a better chance of surviving and recovering from a stroke. What can increase my risk? The following medical conditions may increase your risk of a stroke: Cardiovascular disease. High blood pressure (hypertension). Diabetes. High cholesterol. Sickle cell  disease. Blood clotting disorders (hypercoagulable state). Obesity. Sleep disorders (obstructive sleep apnea). Other risk factors include: Being older than age 62. Having a history of blood clots, stroke, or mini-stroke (transient ischemic attack, TIA). Genetic factors, such as race, ethnicity, or a family history of stroke. Smoking cigarettes or using other tobacco products. Taking birth control pills, especially if you also use tobacco. Heavy use of alcohol or drugs, especially cocaine and methamphetamine. Physical inactivity. What actions can I take to prevent this? Manage your health conditions High cholesterol levels. Eating a healthy diet is important for preventing high cholesterol. If cholesterol cannot be managed through diet alone, you may need to take medicines. Take any prescribed medicines to control your cholesterol as told by your health care provider. Hypertension. To reduce your risk of stroke, try to keep your blood pressure below 130/80. Eating a healthy diet and exercising regularly are important for controlling blood pressure. If these steps are not enough to manage your blood pressure, you may need to take medicines. Take any prescribed medicines to control hypertension as told by your health care provider. Ask your health care provider if you should monitor your blood pressure at home. Have your blood pressure checked every year, even if your blood pressure is normal. Blood pressure increases with age and some medical conditions. Diabetes. Eating a healthy diet and exercising regularly are important parts of managing your blood sugar (glucose). If your blood sugar cannot be managed through diet and exercise, you may need to take medicines. Take any prescribed medicines to control your diabetes as told by your health care provider. Get evaluated for obstructive sleep apnea. Talk to your health care provider about getting a sleep evaluation  if you snore a lot or have  excessive sleepiness. Make sure that any other medical conditions you have, such as atrial fibrillation or atherosclerosis, are managed. Nutrition Follow instructions from your health care provider about what to eat or drink to help manage your health condition. These instructions may include: Reducing your daily calorie intake. Limiting how much salt (sodium) you use to 1,500 milligrams (mg) each day. Using only healthy fats for cooking, such as olive oil, canola oil, or sunflower oil. Eating healthy foods. You can do this by: Choosing foods that are high in fiber, such as whole grains, and fresh fruits and vegetables. Eating at least 5 servings of fruits and vegetables a day. Try to fill one-half of your plate with fruits and vegetables at each meal. Choosing lean protein foods, such as lean cuts of meat, poultry without skin, fish, tofu, beans, and nuts. Eating low-fat dairy products. Avoiding foods that are high in sodium. This can help lower blood pressure. Avoiding foods that have saturated fat, trans fat, and cholesterol. This can help prevent high cholesterol. Avoiding processed and prepared foods. Counting your daily carbohydrate intake.  Lifestyle If you drink alcohol: Limit how much you have to: 0-1 drink a day for women who are not pregnant. 0-2 drinks a day for men. Know how much alcohol is in your drink. In the [place] one drink equals one 12 oz bottle of beer ( ), one 5 oz glass of wine ( ), or one 1 oz glass of hard liquor (44mL). Do not use any products that contain nicotine or tobacco. These products include cigarettes, chewing tobacco, and vaping devices, such as e-cigarettes. If you need help quitting, ask your health care provider. Avoid secondhand smoke. Do not use drugs. Activity  Try to stay at a healthy weight. Get at least 30 minutes of exercise on most days, such as: Fast walking. Biking. Swimming. Medicines Take over-the-counter and prescription  medicines only as told by your health care provider. Aspirin or blood thinners (antiplatelets or anticoagulants) may be recommended to reduce your risk of forming blood clots that can lead to stroke. Avoid taking birth control pills. Talk to your health care provider about the risks of taking birth control pills if: You are over 49 years old. You smoke. You get very bad headaches. You have had a blood clot. Where to find more information American Stroke Association: www.strokeassociation.org Get help right away if: You or a loved one has any symptoms of a stroke. BE FAST is an easy way to remember the main warning signs of a stroke: B - Balance. Signs are dizziness, sudden trouble walking, or loss of balance. E - Eyes. Signs are trouble seeing or a sudden change in vision. F - Face. Signs are sudden weakness or numbness of the face, or the face or eyelid drooping on one side. A - Arms. Signs are weakness or numbness in an arm. This happens suddenly and usually on one side of the body. S - Speech. Signs are sudden trouble speaking, slurred speech, or trouble understanding what people say. T - Time. Time to call emergency services. Write down what time symptoms started. You or a loved one has other signs of a stroke, such as: A sudden, severe headache with no known cause. Nausea or vomiting. Seizure. These symptoms may represent a serious problem that is an emergency. Do not wait to see if the symptoms will go away. Get medical help right away. Call your local emergency services (911 in the [place]  Do not drive yourself to the hospital. Summary You can help to prevent a stroke by eating healthy, exercising, not smoking, limiting alcohol intake, and managing any medical conditions you may have. Do not use any products that contain nicotine or tobacco. These include cigarettes, chewing tobacco, and vaping devices, such as e-cigarettes. If you need help quitting, ask your health care  provider. Remember BE FAST for warning signs of a stroke. Get help right away if you or a loved one has any of these signs. This information is not intended to replace advice given to you by your health care provider. Make sure you discuss any questions you have with your health care provider. Document Revised: 05/04/2022 Document Reviewed: 05/04/2022 Elsevier Patient Education  2024 [employer].

## 2023-12-07 LAB — LIPID PANEL
Chol/HDL Ratio: 3.2 ratio (ref 0.0–5.0)
Cholesterol, Total: 163 mg/dL (ref 100–199)
HDL: 51 mg/dL (ref >39)
LDL Chol Calc (NIH): 88 mg/dL (ref 0–99)
Triglycerides: 140 mg/dL (ref 0–149)
VLDL Cholesterol Cal: 24 mg/dL (ref 5–40)

## 2023-12-07 LAB — HEMOGLOBIN A1C
Est. average glucose Bld gHb Est-mCnc: 131 mg/dL
Hgb A1c MFr Bld: 6.2 % — ABNORMAL HIGH (ref 4.8–5.6)

## 2023-12-12 DIAGNOSIS — I639 Cerebral infarction, unspecified: Secondary | ICD-10-CM | POA: Diagnosis not present

## 2023-12-12 DIAGNOSIS — I6501 Occlusion and stenosis of right vertebral artery: Secondary | ICD-10-CM | POA: Diagnosis not present

## 2023-12-14 ENCOUNTER — Ambulatory Visit: Admitting: Neurology

## 2023-12-14 DIAGNOSIS — Z8669 Personal history of other diseases of the nervous system and sense organs: Secondary | ICD-10-CM

## 2023-12-14 DIAGNOSIS — R569 Unspecified convulsions: Secondary | ICD-10-CM | POA: Diagnosis not present

## 2023-12-17 ENCOUNTER — Ambulatory Visit: Payer: Self-pay | Admitting: Neurology

## 2023-12-17 ENCOUNTER — Other Ambulatory Visit: Payer: Self-pay | Admitting: Neurology

## 2023-12-17 MED ORDER — ATORVASTATIN CALCIUM 40 MG PO TABS: 80.0000 mg | ORAL_TABLET | Freq: Every day | ORAL | 1 refills | Status: DC

## 2023-12-21 NOTE — Telephone Encounter (Signed)
 Called the pt and reviewed the EEG results. Pt verbalized understanding. Pt had no questions at this time but was encouraged to call back if questions arise.

## 2023-12-23 ENCOUNTER — Encounter: Payer: Self-pay | Admitting: Family Medicine

## 2024-01-12 ENCOUNTER — Ambulatory Visit: Attending: Neurology

## 2024-01-12 DIAGNOSIS — I6501 Occlusion and stenosis of right vertebral artery: Secondary | ICD-10-CM

## 2024-01-12 DIAGNOSIS — I639 Cerebral infarction, unspecified: Secondary | ICD-10-CM

## 2024-02-11 DIAGNOSIS — H5210 Myopia, unspecified eye: Secondary | ICD-10-CM | POA: Diagnosis not present

## 2024-02-14 ENCOUNTER — Encounter: Payer: Self-pay | Admitting: Neurology

## 2024-02-15 ENCOUNTER — Encounter: Payer: Self-pay | Admitting: Sports Medicine

## 2024-03-01 ENCOUNTER — Encounter: Payer: Self-pay | Admitting: Neurology

## 2024-03-25 ENCOUNTER — Encounter: Payer: Self-pay | Admitting: Family Medicine

## 2024-03-28 DIAGNOSIS — K080 Exfoliation of teeth due to systemic causes: Secondary | ICD-10-CM | POA: Diagnosis not present

## 2024-04-03 ENCOUNTER — Other Ambulatory Visit: Payer: Self-pay | Admitting: Family Medicine

## 2024-04-03 MED ORDER — ROSUVASTATIN CALCIUM 20 MG PO TABS: 20.0000 mg | ORAL_TABLET | Freq: Every day | ORAL | 1 refills | Status: DC

## 2024-04-06 ENCOUNTER — Other Ambulatory Visit: Payer: Self-pay | Admitting: Family Medicine

## 2024-04-06 DIAGNOSIS — G40909 Epilepsy, unspecified, not intractable, without status epilepticus: Secondary | ICD-10-CM

## 2024-04-06 NOTE — Telephone Encounter (Signed)
 Pls contact pt to schedule appt with Dr. Alvia for medication refills.

## 2024-06-21 ENCOUNTER — Encounter: Payer: Self-pay | Admitting: Family Medicine

## 2024-06-28 ENCOUNTER — Encounter: Payer: Self-pay | Admitting: Family Medicine

## 2024-06-28 ENCOUNTER — Ambulatory Visit: Admitting: Family Medicine

## 2024-06-28 VITALS — BP 121/70 | HR 75 | Ht 71.0 in | Wt 191.0 lb

## 2024-06-28 DIAGNOSIS — Z125 Encounter for screening for malignant neoplasm of prostate: Secondary | ICD-10-CM

## 2024-06-28 DIAGNOSIS — G629 Polyneuropathy, unspecified: Secondary | ICD-10-CM | POA: Diagnosis not present

## 2024-06-28 DIAGNOSIS — I6501 Occlusion and stenosis of right vertebral artery: Secondary | ICD-10-CM

## 2024-06-28 DIAGNOSIS — G459 Transient cerebral ischemic attack, unspecified: Secondary | ICD-10-CM | POA: Diagnosis not present

## 2024-06-28 DIAGNOSIS — E7800 Pure hypercholesterolemia, unspecified: Secondary | ICD-10-CM

## 2024-06-28 NOTE — Assessment & Plan Note (Signed)
 History of TIA and elevated cholesterol.  Rechecking cholesterol today.  Has been intolerant to statins tried so far.  He would be willing to consider a different statin.

## 2024-06-28 NOTE — Progress Notes (Signed)
 " [facility] - 74 y.o. male MRN [id number]  Date of birth: 1951/01/01  Subjective Chief Complaint  Patient presents with   Medication Problem    HPI Robert Hodges is a 74 y.o. male here today for follow-up visit.  He has discontinued Crestor  due to myalgias he tried cutting back on this as well as adding co-Q10 but continued to have same side effects.  He was on atorvastatin  previously but had side effects from this as well.  He is also having some numb sensation with cold feeling in bilateral feet.  He thought this may be related to statin as well.  Denies any pain associated with this.  History of low back pain with disc herniation.  Denies any increasing back pain.  ROS:  A comprehensive ROS was completed and negative except as noted per HPI  Allergies[1]  Past Medical History:  Diagnosis Date   GERD (gastroesophageal reflux disease)    control with meds   Neuromuscular disorder (HCC) 2010   Pudendal Neuralgia   Seizure disorder [facility])     Past Surgical History:  Procedure Laterality Date   EYE SURGERY     Lasik   pudendal nerve  pudendal nerve    surgery   SPINE SURGERY  07/24/2022   Lumbar Micro-discectomy    Social History   Socioeconomic History   Marital status: Married    Spouse name: Tammy   Number of children: 4   Years of education: 14   Highest education level: Associate degree: occupational, scientist, product/process development, or vocational program  Occupational History   Occupation: Retired  Tobacco Use   Smoking status: Never   Smokeless tobacco: Never  Vaping Use   Vaping status: Never Used  Substance and Sexual Activity   Alcohol use: Yes    Alcohol/week: 3.0 - 4.0 standard drinks of alcohol    Types: 3 - 4 Standard drinks or equivalent per week    Comment: occassional   Drug use: Not Currently    Types: Other-see comments    Comment: cbd gummies   Sexual activity: Not Currently    Partners: Female  Other Topics Concern   Not on file  Social History  Narrative   Lives with his wife, son, daughter and grand-daughter. He has four children. He enjoys bowling, hiking, walking and going to the gun range.    Social Drivers of Health   Tobacco Use: Low Risk (12/06/2023)   Patient History    Smoking Tobacco Use: Never    Smokeless Tobacco Use: Never    Passive Exposure: Not on file  Recent Concern: Tobacco Use - Medium Risk (09/15/2023)   Patient History    Smoking Tobacco Use: Former    Smokeless Tobacco Use: Never    Passive Exposure: Not on file  Financial Resource Strain: Low Risk (11/17/2023)   Overall Financial Resource Strain (CARDIA)    Difficulty of Paying Living Expenses: Not hard at all  Food Insecurity: No Food Insecurity (11/17/2023)   Hunger Vital Sign    Worried About Running Out of Food in the Last Year: Never true    Ran Out of Food in the Last Year: Never true  Transportation Needs: No Transportation Needs (11/17/2023)   PRAPARE - Administrator, Civil Service (Medical): No    Lack of Transportation (Non-Medical): No  Physical Activity: Sufficiently Active (11/17/2023)   Exercise Vital Sign    Days of Exercise per Week: 5 days    Minutes of Exercise per  Session: 30 min  Stress: No Stress Concern Present (11/17/2023)   [employer] of Occupational Health - Occupational Stress Questionnaire    Feeling of Stress : Not at all  Social Connections: Moderately Integrated (11/17/2023)   Social Connection and Isolation Panel    Frequency of Communication with Friends and Family: Never    Frequency of Social Gatherings with Friends and Family: Once a week    Attends Religious Services: More than 4 times per year    Active Member of Clubs or Organizations: Yes    Attends Banker Meetings: More than 4 times per year    Marital Status: Married  Depression (PHQ2-9): Low Risk (06/28/2024)   Depression (PHQ2-9)    PHQ-2 Score: 1  Alcohol Screen: Low Risk (11/08/2022)   Alcohol Screen    Last Alcohol Screening  Score (AUDIT): 3  Housing: Low Risk (11/17/2023)   Housing Stability Vital Sign    Unable to Pay for Housing in the Last Year: No    Number of Times Moved in the Last Year: 0    Homeless in the Last Year: No  Utilities: Not At Risk (11/17/2023)   AHC Utilities    Threatened with loss of utilities: No  Health Literacy: Adequate Health Literacy (11/17/2023)   B1300 Health Literacy    Frequency of need for help with medical instructions: Never    Family History  Problem Relation Age of Onset   Heart failure Mother     Health Maintenance  Topic Date Due   COVID-19 Vaccine (1) 07/14/2024 (Originally 07/09/1951)   Influenza Vaccine  09/12/2024 (Originally 01/14/2024)   Zoster Vaccines- Shingrix (1 of 2) 09/26/2024 (Originally 01/07/1968)   DTaP/Tdap/Td (3 - Td or Tdap) 06/28/2025 (Originally 01/13/2024)   Pneumococcal Vaccine: 50+ Years (1 of 1 - PCV) 06/28/2025 (Originally 01/07/1999)   Medicare Annual Wellness (AWV)  11/16/2024   Hepatitis C Screening  Completed   Meningococcal B Vaccine  Aged Out   Colonoscopy  Discontinued     ----------------------------------------------------------------------------------------------------------------------------------------------------------------------------------------------------------------- Physical Exam BP 121/70 (BP Location: Left Arm, Patient Position: Sitting, Cuff Size: Normal)   Pulse 75   Ht 5' 11 (1.803 m)   Wt 191 lb (86.6 kg)   SpO2 98%   BMI 26.64 kg/m   Physical Exam Constitutional:      Appearance: Normal appearance.  HENT:     Head: Normocephalic and atraumatic.  Cardiovascular:     Rate and Rhythm: Normal rate and regular rhythm.     Comments: 2+ dorsalis pedis and posterior tibial pulses Pulmonary:     Effort: Pulmonary effort is normal.     Breath sounds: Normal breath sounds.  Neurological:     General: No focal deficit present.     Mental Status: He is alert.  Psychiatric:        Mood and Affect: Mood normal.         Behavior: Behavior normal.     ------------------------------------------------------------------------------------------------------------------------------------------------------------------------------------------------------------------- Assessment and Plan  TIA (transient ischemic attack) History of TIA and elevated cholesterol.  Rechecking cholesterol today.  Has been intolerant to statins tried so far.  He would be willing to consider a different statin.  Peripheral polyneuropathy Normal peripheral pulses.  Checking TSH and B12.  Can try alpha lipoic acid.   No orders of the defined types were placed in this encounter.   No follow-ups on file.         [1] No Known Allergies  "

## 2024-06-28 NOTE — Assessment & Plan Note (Signed)
 Normal peripheral pulses.  Checking TSH and B12.  Can try alpha lipoic acid.

## 2024-06-28 NOTE — Patient Instructions (Addendum)
 Try adding alpha-lipoic-acid

## 2024-06-29 LAB — CBC WITH DIFFERENTIAL/PLATELET
Basophils Absolute: 0 x10E3/uL (ref 0.0–0.2)
Basos: 0 %
EOS (ABSOLUTE): 0.3 x10E3/uL (ref 0.0–0.4)
Eos: 4 %
Hematocrit: 43.5 % (ref 37.5–51.0)
Hemoglobin: 14.7 g/dL (ref 13.0–17.7)
Immature Grans (Abs): 0 x10E3/uL (ref 0.0–0.1)
Immature Granulocytes: 0 %
Lymphocytes Absolute: 2.5 x10E3/uL (ref 0.7–3.1)
Lymphs: 35 %
MCH: 31.6 pg (ref 26.6–33.0)
MCHC: 33.8 g/dL (ref 31.5–35.7)
MCV: 94 fL (ref 79–97)
Monocytes Absolute: 0.6 x10E3/uL (ref 0.1–0.9)
Monocytes: 8 %
Neutrophils Absolute: 3.8 x10E3/uL (ref 1.4–7.0)
Neutrophils: 53 %
Platelets: 247 x10E3/uL (ref 150–450)
RBC: 4.65 x10E6/uL (ref 4.14–5.80)
RDW: 11.9 % (ref 11.6–15.4)
WBC: 7.2 x10E3/uL (ref 3.4–10.8)

## 2024-06-29 LAB — CMP14+EGFR
ALT: 26 IU/L (ref 0–44)
AST: 18 IU/L (ref 0–40)
Albumin: 4.5 g/dL (ref 3.8–4.8)
Alkaline Phosphatase: 74 IU/L (ref 47–123)
BUN/Creatinine Ratio: 14 (ref 10–24)
BUN: 15 mg/dL (ref 8–27)
Bilirubin Total: 0.4 mg/dL (ref 0.0–1.2)
CO2: 22 mmol/L (ref 20–29)
Calcium: 9.2 mg/dL (ref 8.6–10.2)
Chloride: 103 mmol/L (ref 96–106)
Creatinine, Ser: 1.05 mg/dL (ref 0.76–1.27)
Globulin, Total: 2.4 g/dL (ref 1.5–4.5)
Glucose: 122 mg/dL — ABNORMAL HIGH (ref 70–99)
Potassium: 4.4 mmol/L (ref 3.5–5.2)
Sodium: 140 mmol/L (ref 134–144)
Total Protein: 6.9 g/dL (ref 6.0–8.5)
eGFR: 74 mL/min/1.73

## 2024-06-29 LAB — PSA: Prostate Specific Ag, Serum: 1.8 ng/mL (ref 0.0–4.0)

## 2024-06-29 LAB — B12 AND FOLATE PANEL
Folate: 15 ng/mL
Vitamin B-12: 138 pg/mL — ABNORMAL LOW (ref 232–1245)

## 2024-06-29 LAB — LIPID PANEL WITH LDL/HDL RATIO
Cholesterol, Total: 230 mg/dL — ABNORMAL HIGH (ref 100–199)
HDL: 47 mg/dL
LDL Chol Calc (NIH): 156 mg/dL — ABNORMAL HIGH (ref 0–99)
LDL/HDL Ratio: 3.3 ratio (ref 0.0–3.6)
Triglycerides: 150 mg/dL — ABNORMAL HIGH (ref 0–149)
VLDL Cholesterol Cal: 27 mg/dL (ref 5–40)

## 2024-06-29 LAB — TSH: TSH: 1.88 u[IU]/mL (ref 0.450–4.500)

## 2024-07-02 ENCOUNTER — Ambulatory Visit: Payer: Self-pay | Admitting: Family Medicine

## 2024-07-05 ENCOUNTER — Ambulatory Visit: Admitting: Neurology

## 2024-07-14 ENCOUNTER — Encounter: Payer: Self-pay | Admitting: Family Medicine

## 2024-07-14 ENCOUNTER — Ambulatory Visit (INDEPENDENT_AMBULATORY_CARE_PROVIDER_SITE_OTHER)

## 2024-07-14 VITALS — BP 139/75 | HR 76

## 2024-07-14 DIAGNOSIS — E538 Deficiency of other specified B group vitamins: Secondary | ICD-10-CM

## 2024-07-14 MED ORDER — CYANOCOBALAMIN 1000 MCG/ML IJ SOLN
1000.0000 ug | INTRAMUSCULAR | Status: AC
  Administered 2024-07-14 – 2024-07-21 (×2): 1000 ug via INTRAMUSCULAR

## 2024-07-14 MED ORDER — FAMOTIDINE 20 MG PO TABS: 20.0000 mg | ORAL_TABLET | Freq: Two times a day (BID) | ORAL | 1 refills | Status: AC

## 2024-07-14 NOTE — Progress Notes (Signed)
" ° °  Subjective:    Patient ID: Robert Hodges, male    DOB: Jan 11, 1951, 74 y.o.   MRN: [id number]  HPI  Patient is here for his initial B12 injection. He is to do injections weekly for 4 wks and then transition to once monthly for 3-4 months and then recheck levels. Denies muscle cramps, weakness, or irregular HR.  Review of Systems     Objective:   Physical Exam        Assessment & Plan:   Patient given injection in his LD. Tolerated well. No redness or swelling noted at the injection site. Patient advised to RTC in 7 days for his second out of 4 injections. "

## 2024-07-21 ENCOUNTER — Ambulatory Visit

## 2024-07-21 VITALS — BP 146/82 | HR 75 | Resp 18 | Ht 71.0 in | Wt 191.0 lb

## 2024-07-21 DIAGNOSIS — E538 Deficiency of other specified B group vitamins: Secondary | ICD-10-CM

## 2024-07-21 NOTE — Progress Notes (Signed)
" ° °  Subjective:    Patient ID: Robert Hodges, male    DOB: 04-09-1951, 74 y.o.   MRN: [id number]  HPI  Patient is here for his 2nd wk B 12 injection. He is to doing injections weekly for 4 wks and then transitioning to once monthly for 3-4 months and then recheck levels. Denies muscle cramps, weakness, or irregular HR.   Review of Systems     Objective:   Physical Exam       Assessment & Plan:   Patient given injection in his RD. Tolerated well. No redness or swelling noted at the injection site. Patient advised to RTC in 7 days for his third out of 4 wkly injections. (Around 07/28/24) "

## 2024-07-28 ENCOUNTER — Ambulatory Visit

## 2024-11-21 ENCOUNTER — Ambulatory Visit
